# Patient Record
Sex: Female | Born: 1935 | Race: Black or African American | Hispanic: No | Smoking: Never smoker
Health system: Southern US, Community
[De-identification: ages and names within clinical notes are randomized; demographics above are authoritative.]

## PROBLEM LIST (undated history)

## (undated) DIAGNOSIS — E785 Hyperlipidemia, unspecified: Secondary | ICD-10-CM

## (undated) DIAGNOSIS — I10 Essential (primary) hypertension: Secondary | ICD-10-CM

## (undated) HISTORY — PX: WRIST FRACTURE SURGERY: SHX121

## (undated) HISTORY — DX: Hyperlipidemia, unspecified: E78.5

## (undated) HISTORY — DX: Essential (primary) hypertension: I10

---

## 2016-12-12 LAB — BASIC METABOLIC PANEL
BUN: 14 (ref 4–21)
CREATININE: 0.9 (ref 0.5–1.1)
GLUCOSE: 87
POTASSIUM: 3.6 (ref 3.4–5.3)
Sodium: 144 (ref 137–147)

## 2016-12-12 LAB — LIPID PANEL
CHOLESTEROL: 174 (ref 0–200)
HDL: 66 (ref 35–70)
LDL Cholesterol: 84
Triglycerides: 118 (ref 40–160)

## 2017-01-20 LAB — HM MAMMOGRAPHY

## 2017-04-15 ENCOUNTER — Ambulatory Visit: Payer: Medicare HMO | Admitting: Family Medicine

## 2017-04-15 ENCOUNTER — Encounter: Payer: Self-pay | Admitting: Family Medicine

## 2017-04-15 ENCOUNTER — Ambulatory Visit: Payer: Self-pay | Admitting: Family Medicine

## 2017-04-15 DIAGNOSIS — I10 Essential (primary) hypertension: Secondary | ICD-10-CM

## 2017-04-15 DIAGNOSIS — E785 Hyperlipidemia, unspecified: Secondary | ICD-10-CM

## 2017-04-15 DIAGNOSIS — M8580 Other specified disorders of bone density and structure, unspecified site: Secondary | ICD-10-CM | POA: Insufficient documentation

## 2017-04-15 DIAGNOSIS — M810 Age-related osteoporosis without current pathological fracture: Secondary | ICD-10-CM | POA: Insufficient documentation

## 2017-04-15 NOTE — Progress Notes (Signed)
Subjective:  Stephanie Hunt is a 82 y.o. female who presents today with a chief complaint of hypertension and to establish care.   HPI:  Patiently recently [place] closer to family.  She is here with her husband who I will also be sending.  Hypertension, Chronic Problem, new to this provider BP Readings from Last 3 Encounters:  04/15/17 100/80   Current Medications: Valsartan-HCTZ 160-25 daily, compliant without side effects. Interim History: Long-standing history.  Has been well controlled on the above medication.  ROS: Denies any chest pain, shortness of breath, dyspnea on exertion, leg edema.   Hyperlipidemia, Chronic, new problem to this provider Current medication(s): Lovastatin 40 mg daily Side Effects: None Additional history: Long-standing problem.  Has been on lovastatin for several years has done well with this.  Had blood work done about 2-3 months ago.  ROS: No chest pain or shortness of breath. No myalgias.  Osteopenia, chronic problem, new this provider Up-to-date on bone density scanning.  She is currently taking calcium-vitamin D supplements.  She has a remote history of wrist fracture but no recent fractures.  ROS: Per HPI, otherwise a 14 point review of systems was performed and was negative  PMH:  The following were reviewed and entered/updated in epic: Past Medical History:  Diagnosis Date  . Hyperlipidemia   . Hypertension    Patient Active Problem List   Diagnosis Date Noted  . Hypertension 04/15/2017  . Hyperlipidemia 04/15/2017  . Osteopenia 04/15/2017   Past Surgical History:  Procedure Laterality Date  . WRIST FRACTURE SURGERY Bilateral     Family History  Problem Relation Age of Onset  . Cancer Mother        Uterus  . Hypertension Father     Medications- reviewed and updated Current Outpatient Medications  Medication Sig Dispense Refill  . Calcium Carbonate-Vitamin D (CALTRATE 600+D PO) Take by  mouth.    . lovastatin (MEVACOR) 40 MG tablet Take 40 mg at bedtime by mouth.    . valsartan-hydrochlorothiazide (DIOVAN-HCT) 160-25 MG tablet Take 1 tablet daily by mouth.     No current facility-administered medications for this visit.    Allergies-reviewed and updated Allergies  Allergen Reactions  . Penicillins Rash   Social History   Socioeconomic History  . Marital status: Married    Spouse name: None  . Number of children: None  . Years of education: None  . Highest education level: None  Social Needs  . Financial resource strain: None  . Food insecurity - worry: None  . Food insecurity - inability: None  . Transportation needs - medical: None  . Transportation needs - non-medical: None  Occupational History  . Occupation: retired  Tobacco Use  . Smoking status: Never Smoker  . Smokeless tobacco: Never Used  Substance and Sexual Activity  . Alcohol use: No    Frequency: Never  . Drug use: No  . Sexual activity: No  Other Topics Concern  . None  Social History Narrative  . None   Objective:  Physical Exam: BP 100/80   Pulse 67   Ht 5\' 2"  (1.575 m)   Wt 140 lb (63.5 kg)   SpO2 97%   BMI 25.61 kg/m   Gen: NAD, resting comfortably CV: RRR with no murmurs appreciated Pulm: NWOB, CTAB with no crackles, wheezes, or rhonchi GI: Normal bowel sounds present. Soft, Nontender, Nondistended. MSK: No edema, cyanosis, or clubbing noted Skin: Warm, dry Neuro: Grossly normal, moves  all extremities Psych: Normal affect and thought content  Assessment/Plan:  Hypertension At goal.  Continue valsartan-HCTZ.  Follow-up in 6 months.  Will obtain recent blood work.  Hyperlipidemia Continue lovastatin.  Obtain prior blood work.  Follow-up in 6 months.  Osteopenia Continue calcium and vitamin D supplementation.  Obtain prior records.  Consider repeat DEXA pending results of her last one.  Preventative healthcare Patient reports that she had is up-to-date on her and  pneumonia.  We will obtain prior records.  Recommended annual wellness visit with patient-said she would look into this and schedule when she is ready.  Katina Degreealeb M. Jimmey RalphParker, MD 04/15/2017 3:43 PM

## 2017-04-15 NOTE — Assessment & Plan Note (Signed)
Continue lovastatin.  Obtain prior blood work.  Follow-up in 6 months.

## 2017-04-15 NOTE — Assessment & Plan Note (Signed)
At goal.  Continue valsartan-HCTZ.  Follow-up in 6 months.  Will obtain recent blood work.

## 2017-04-15 NOTE — Assessment & Plan Note (Signed)
Continue calcium and vitamin D supplementation.  Obtain prior records.  Consider repeat DEXA pending results of her last one.

## 2017-05-15 ENCOUNTER — Other Ambulatory Visit: Payer: Self-pay | Admitting: *Deleted

## 2017-05-15 MED ORDER — LOVASTATIN 40 MG PO TABS: 40.0000 mg | ORAL_TABLET | Freq: Every day | ORAL | 1 refills | Status: DC

## 2017-05-15 MED ORDER — VALSARTAN-HYDROCHLOROTHIAZIDE 160-25 MG PO TABS: 1.0000 | ORAL_TABLET | Freq: Every day | ORAL | 1 refills | Status: DC

## 2017-08-13 ENCOUNTER — Ambulatory Visit: Payer: Medicare HMO | Admitting: Family Medicine

## 2017-08-13 ENCOUNTER — Encounter: Payer: Self-pay | Admitting: Family Medicine

## 2017-08-13 VITALS — BP 124/78 | HR 60 | Temp 97.5°F | Ht 62.0 in | Wt 139.6 lb

## 2017-08-13 DIAGNOSIS — R079 Chest pain, unspecified: Secondary | ICD-10-CM

## 2017-08-13 DIAGNOSIS — I10 Essential (primary) hypertension: Secondary | ICD-10-CM

## 2017-08-13 DIAGNOSIS — E785 Hyperlipidemia, unspecified: Secondary | ICD-10-CM

## 2017-08-13 MED ORDER — VALSARTAN-HYDROCHLOROTHIAZIDE 160-25 MG PO TABS: 1.0000 | ORAL_TABLET | Freq: Every day | ORAL | 3 refills | Status: DC

## 2017-08-13 NOTE — Assessment & Plan Note (Signed)
At goal.  Continue valsartan-HCTZ.  Will need BMET soon.

## 2017-08-13 NOTE — Patient Instructions (Signed)
I refill your BP medication.  Your EKG today looks ok.  I would like for you to get a stress test soon. I will put in this order.  Take care, Dr Jimmey RalphParker

## 2017-08-13 NOTE — Assessment & Plan Note (Signed)
Continue lovastatin.  We have not yet obtained her most recent lipid panel.  She will follow-up with me in a few months.  If we have still not obtained records, we will check a lipid panel here.

## 2017-08-13 NOTE — Progress Notes (Signed)
    Subjective:  Stephanie Hunt is a 83 y.o. female who presents today for same-day appointment with a chief complaint of chest pain.   HPI:  Chest Pain, new problem Started a few weeks ago. Pain located in the middle of her chest and radiates into her left arm. She has been under a lot of stress recently due to her husband being hospitalized.  Pain is intermittent in nature however sometimes is worse with movement.  Pain does not improve with rest.  No shortness of breath.  No nausea or vomiting.  No fevers or chills.    HTN, established problem, stable On valsartan-HCTZ. Tolerates well without side effects.  Hyperlipidemia, established problem, stable On lovastatin 40 mg daily.  Tolerates this well without side effects.  ROS: Per HPI  PMH: She reports that  has never smoked. she has never used smokeless tobacco. She reports that she does not drink alcohol or use drugs.  Objective:  Physical Exam: BP 124/78 (BP Location: Left Arm, Patient Position: Sitting, Cuff Size: Normal)   Pulse 60   Temp (!) 97.5 F (36.4 C) (Oral)   Ht 5\' 2"  (1.575 m)   Wt 139 lb 9.6 oz (63.3 kg)   SpO2 98%   BMI 25.53 kg/m   Gen: NAD, resting comfortably CV: RRR with no murmurs appreciated Pulm: NWOB, CTAB with no crackles, wheezes, or rhonchi  EKG: Normal sinus rhythm.  2 PACs noted.  No acute ischemic changes.  Assessment/Plan:  Chest pain Her symptoms are fairly atypical in nature and her EKG is reassuring, however she does have a few risk factors including age, hypertension, and hyperlipidemia.  Advised patient that we should get a stress test to rule out cardiac etiology.  Patient voiced understanding.  Discussed strict reasons to seek emergent care including worsening symptoms, severe chest pain, shortness of breath, nausea/vomiting.  Hypertension At goal.  Continue valsartan-HCTZ.  Will need BMET soon.  Hyperlipidemia Continue lovastatin.  We have not yet obtained her most recent lipid panel.   She will follow-up with me in a few months.  If we have still not obtained records, we will check a lipid panel here.  Katina Degreealeb M. Jimmey RalphParker, MD 08/13/2017 10:25 AM

## 2017-08-15 ENCOUNTER — Other Ambulatory Visit: Payer: Self-pay

## 2017-08-15 MED ORDER — HYDROCHLOROTHIAZIDE 25 MG PO TABS: 25.0000 mg | ORAL_TABLET | Freq: Every day | ORAL | 1 refills | Status: DC

## 2017-08-15 MED ORDER — VALSARTAN 160 MG PO TABS: 160.0000 mg | ORAL_TABLET | Freq: Every day | ORAL | 1 refills | Status: DC

## 2017-08-20 NOTE — Progress Notes (Signed)
Subjective:   Stephanie Hunt is a 83 y.o. female who presents for Medicare Annual (Subsequent) preventive examination.  Reports health as  Dr. Jimmey RalphParker OV 08/14/27 for chest pain    Psychosocial Moved from [place] to [place] with her spouse Moved here 6 months ago Dr. Paul DykesMaurice [place]endergrass  [place]  [phone] She has requested medical records from Dr. Judson RochPendergrass's office   No care everywhere notes   Diet Eating well    BMI 24.6   Exercise [facility] [place]  Colonoscopy; does not need any more  All preventive health is up to date per the patient's report Awaiting medical records     Hearing Screening   125Hz  250Hz  500Hz  1000Hz  2000Hz  3000Hz  4000Hz  6000Hz  8000Hz   Right ear:       100    Left ear:       100    Comments: No hearing loss  Vision Screening Comments: Had laser  Still looking for a eye doctor Has  Vision check April or May 2018     There are no preventive care reminders to display for this patient.Cardiac Risk Factors include: advanced age (>5755men, 41>65 women);family history of premature cardiovascular disease;hypertension     Objective:     Vitals: BP 140/80   Pulse 81   Ht 5\' 3"  (1.6 m)   Wt 139 lb (63 kg)   SpO2 98%   BMI 24.62 kg/m   Body mass index is 24.62 kg/m.  Advanced Directives 08/21/2017  Does Patient Have a Medical Advance Directive? Yes    Tobacco Social History   Tobacco Use  Smoking Status Never Smoker  Smokeless Tobacco Never Used     Counseling given: Yes   Clinical Intake:     Past Medical History:  Diagnosis Date  . Hyperlipidemia   . Hypertension    Past Surgical History:  Procedure Laterality Date  . WRIST FRACTURE SURGERY Bilateral    Family History  Problem Relation Age of Onset  . Cancer Mother        Uterus  . Hypertension Father    Social History   Socioeconomic History  . Marital status: Married    Spouse name: Not on file  . Number of children: Not on file  .  Years of education: Not on file  . Highest education level: Not on file  Occupational History  . Occupation: retired  Engineer, productionocial Needs  . Financial resource strain: Not on file  . Food insecurity:    Worry: Not on file    Inability: Not on file  . Transportation needs:    Medical: Not on file    Non-medical: Not on file  Tobacco Use  . Smoking status: Never Smoker  . Smokeless tobacco: Never Used  Substance and Sexual Activity  . Alcohol use: No    Frequency: Never  . Drug use: No  . Sexual activity: Never  Lifestyle  . Physical activity:    Days per week: Not on file    Minutes per session: Not on file  . Stress: Not on file  Relationships  . Social connections:    Talks on phone: Not on file    Gets together: Not on file    Attends religious service: Not on file    Active member of club or organization: Not on file    Attends meetings of clubs or organizations: Not on file    Relationship status: Not on file  Other Topics Concern  . Not on file  Social History Narrative  . Not on file    Outpatient Encounter Medications as of 08/21/2017  Medication Sig  . Calcium Carbonate-Vitamin D (CALTRATE 600+D PO) Take by mouth.  . hydrochlorothiazide (HYDRODIURIL) 25 MG tablet Take 1 tablet (25 mg total) by mouth daily.  [name] lovastatin (MEVACOR) 40 MG tablet Take 1 tablet (40 mg total) by mouth at bedtime.  . valsartan (DIOVAN) 160 MG tablet Take 1 tablet (160 mg total) by mouth daily.  . valsartan-hydrochlorothiazide (DIOVAN-HCT) 160-25 MG tablet Take 1 tablet by mouth daily.   No facility-administered encounter medications on file as of 08/21/2017.     Activities of Daily Living In your present state of health, do you have any difficulty performing the following activities: 08/21/2017  Hearing? N  Vision? N  Difficulty concentrating or making decisions? N  Walking or climbing stairs? N  Dressing or bathing? N  Doing errands, shopping? N  Preparing Food and eating ? N  Using  the Toilet? N  In the past six months, have you accidently leaked urine? N  Do you have problems with loss of bowel control? N  Managing your Medications? N  Managing your Finances? N  Housekeeping or managing your Housekeeping? N    Patient Care Team: Ardith Dark, MD as PCP - General (Family Medicine)    Assessment:   This is a routine wellness examination for Stephanie Hunt.  Exercise Activities and Dietary recommendations Current Exercise Habits: Home exercise routine(new to the area and will explore opportunities ), Type of exercise: walking  Goals    . Patient Stated     Take Saturday to do something "normal" that you enjoy.        Fall Risk Fall Risk  08/21/2017 04/15/2017  Falls in the past year? No No     Depression Screen PHQ 2/9 Scores 08/21/2017 04/15/2017  PHQ - 2 Score 0 0     Cognitive Function MMSE - Mini Mental State Exam 08/21/2017  Not completed: (No Data)     Ad8 score reviewed for issues:  Issues making decisions:  Less interest in hobbies / activities:  Repeats questions, stories (family complaining):  Trouble using ordinary gadgets (microwave, computer, phone):  Forgets the month or year:   Mismanaging finances:   Remembering appts:  Daily problems with thinking and/or memory: Ad8 score is=0    There is no immunization history on file for this patient.    Screening Tests Health Maintenance  Topic Date Due  . DEXA SCAN  03/02/2018 (Originally 01/19/2002)  . PNA vac Low Risk Adult (1 of 2 - PCV13) 03/03/2019 (Originally 01/19/2002)  . TETANUS/TDAP  02/01/2027  . INFLUENZA VACCINE  Completed         Plan:      PCP Notes   Health Maintenance HM (mammogram; dexa, pneumonia vaccines and flu vaccine are all up to date per the patient Contact was made with her prior physician; geriatrician Dr. Shari Prows  [address] [place], [name] [address] The office does not have a release; will complete one and send to the  patient to sign and mail. Will postpone metric for 6 months pending information  She states she had zoster, but educated her regarding the shingrix as well   Abnormal Screens  BP was mod elevated. Set a goal to start taking a day off on Saturday  Also agreed to [facility] a local [facility] Will agreed to schedule time with the  family to assume some of hte care of her spouse  fup with Cardiology for stress test  Referrals  None   Patient concerns; Caring for spouse with memory loss- moderate level  Spent 30 minutes of education and resources given for assistance with daily needs as well as long term needs  Home health coming in now but spouse is rejecting them   Nurse Concerns; As noted  Given number for assistance in the future  Very youthful 83 yo.   Next PCP apt [facility]      I have personally reviewed and noted the following in the patient's chart:   . Medical and social history . Use of alcohol, tobacco or illicit drugs  . Current medications and supplements . Functional ability and status . Nutritional status . Physical activity . Advanced directives . List of other physicians . Hospitalizations, surgeries, and ER visits in previous 12 months . Vitals . Screenings to include cognitive, depression, and falls . Referrals and appointments  In addition, I have reviewed and discussed with patient certain preventive protocols, quality metrics, and best practice recommendations. A written personalized care plan for preventive services as well as general preventive health recommendations were provided to patient.     Montine Circle, RN  08/21/2017

## 2017-08-21 ENCOUNTER — Telehealth: Payer: Self-pay

## 2017-08-21 ENCOUNTER — Encounter: Payer: Self-pay | Admitting: *Deleted

## 2017-08-21 ENCOUNTER — Ambulatory Visit (INDEPENDENT_AMBULATORY_CARE_PROVIDER_SITE_OTHER): Payer: Medicare HMO | Admitting: *Deleted

## 2017-08-21 VITALS — BP 140/80 | HR 81 | Ht 63.0 in | Wt 139.0 lb

## 2017-08-21 DIAGNOSIS — Z0000 Encounter for general adult medical examination without abnormal findings: Secondary | ICD-10-CM

## 2017-08-21 NOTE — Patient Instructions (Addendum)
Stephanie Hunt , Thank you for taking time to come for your Medicare Wellness Visit. I appreciate your ongoing commitment to your health goals. Please review the following plan we discussed and let me know if I can assist you in the future.  Stephanie Hunt nu [phone]  Please review any information online from [employer] 24 hour day is a great read!  Will check your records for the date of your Bone density  Mammogram  Pap  Dates of your pneumonia vaccines  And tetanus   You had the zostervax which was a shingles vaccine. Now we have a new vaccines called the shingrix Shingrix is a vaccine for the prevention of Shingles in Adults 50 and older.  If you are on Medicare, you can request a prescription from your doctor to be filled at a pharmacy.  Please check with your benefits regarding applicable copays or out of pocket expenses.  The Shingrix is given in 2 vaccines approx 8 weeks apart. You must receive the 2nd dose prior to 6 months from receipt of the first.   May try to find a Y here near you .   These are the goals we discussed: Goals    . Patient Stated     Take Saturday to do something "normal" that you enjoy.        This is a list of the screening recommended for you and due dates:  Health Maintenance  Topic Date Due  . DEXA scan (bone density measurement)  01/19/2002  . Pneumonia vaccines (1 of 2 - PCV13) 01/19/2002  . Tetanus Vaccine  02/01/2027  . Flu Shot  Completed    Diabetes and weight loss; [facility]  Phone: [phone]   [place] [facility]; [phone] ASK FOR [facility] Ask about caregiver groups  Sr. Line; 251-705-2429 Get resource to get information on any and all community programs for Seniors  Community solutions; "Aging Gracefully In Place" program; can request or apply  [employer]: 985-258-5515 Community Health Response Program -253-664-4034 Public Health Dept; Need to be a skilled visit but can assist with  bathing as well; [phone]  Adult center for Enrichment;  Call Senior Line; [phone]  Adult day services include Adult Day Care, Adult Day Healthcare, Group Respite, Care Partners, Volunteer In [employer], Education and Support Program   Dept of Social Services; Call [phone] and ask for SW on call  Options for Medicaid include the Community Alternatives program; [place] (personal care services) or PACE program, which is a medical and social program combined   Caregiver support group and information regarding [place] is at the; [facility] Address: [address] [place], [place] [address]  Phone: [phone]     Deaf & Hard of [place] - can assist with hearing aid x 1  No reviews  [place]  [place] [address]  [phone]     Fall Prevention in the [place] can cause injuries. They can happen to people of all ages. There are many things you can do to make your home safe and to help prevent falls. What can I do on the outside of my home?  Regularly fix the edges of walkways and driveways and fix any cracks.  Remove anything that might make you trip as you walk through a door, such as a raised step or threshold.  Trim any bushes or trees on the path to your home.  Use  bright outdoor lighting.  Clear any walking paths of anything that might make someone trip, such as rocks or tools.  Regularly check to see if handrails are loose or broken. Make sure that both sides of any steps have handrails.  Any raised decks and porches should have guardrails on the edges.  Have any leaves, snow, or ice cleared regularly.  Use sand or salt on walking paths during winter.  Clean up any spills in your garage right away. This includes oil or grease spills. What can I do in the bathroom?  Use night lights.  Install grab bars by the toilet and in the tub and shower. Do not use towel bars as  grab bars.  Use non-skid mats or decals in the tub or shower.  If you need to sit down in the shower, use a plastic, non-slip stool.  Keep the floor dry. Clean up any water that spills on the floor as soon as it happens.  Remove soap buildup in the tub or shower regularly.  Attach bath mats securely with double-sided non-slip rug tape.  Do not have throw rugs and other things on the floor that can make you trip. What can I do in the bedroom?  Use night lights.  Make sure that you have a light by your bed that is easy to reach.  Do not use any sheets or blankets that are too big for your bed. They should not hang down onto the floor.  Have a firm chair that has side arms. You can use this for support while you get dressed.  Do not have throw rugs and other things on the floor that can make you trip. What can I do in the kitchen?  Clean up any spills right away.  Avoid walking on wet floors.  Keep items that you use a lot in easy-to-reach places.  If you need to reach something above you, use a strong step stool that has a grab bar.  Keep electrical cords out of the way.  Do not use floor polish or wax that makes floors slippery. If you must use wax, use non-skid floor wax.  Do not have throw rugs and other things on the floor that can make you trip. What can I do with my stairs?  Do not leave any items on the stairs.  Make sure that there are handrails on both sides of the stairs and use them. Fix handrails that are broken or loose. Make sure that handrails are as long as the stairways.  Check any carpeting to make sure that it is firmly attached to the stairs. Fix any carpet that is loose or worn.  Avoid having throw rugs at the top or bottom of the stairs. If you do have throw rugs, attach them to the floor with carpet tape.  Make sure that you have a light switch at the top of the stairs and the bottom of the stairs. If you do not have them, ask someone to add them  for you. What else can I do to help prevent falls?  Wear shoes that: ? Do not have high heels. ? Have rubber bottoms. ? Are comfortable and fit you well. ? Are closed at the toe. Do not wear sandals.  If you use a stepladder: ? Make sure that it is fully opened. Do not climb a closed stepladder. ? Make sure that both sides of the stepladder are locked into place. ? Ask someone to hold it for you,  if possible.  Clearly mark and make sure that you can see: ? Any grab bars or handrails. ? First and last steps. ? Where the edge of each step is.  Use tools that help you move around (mobility aids) if they are needed. These include: ? Canes. ? Walkers. ? Scooters. ? Crutches.  Turn on the lights when you go into a dark area. Replace any light bulbs as soon as they burn out.  Set up your furniture so you have a clear path. Avoid moving your furniture around.  If any of your floors are uneven, fix them.  If there are any pets around you, be aware of where they are.  Review your medicines with your doctor. Some medicines can make you feel dizzy. This can increase your chance of falling. Ask your doctor what other things that you can do to help prevent falls. This information is not intended to replace advice given to you by your health care provider. Make sure you discuss any questions you have with your health care provider. Document Released: 03/16/2009 Document Revised: 10/26/2015 Document Reviewed: 06/24/2014 Elsevier Interactive Patient Education  2018 [place] Maintenance, Female Adopting a healthy lifestyle and getting preventive care can go a long way to promote health and wellness. Talk with your health care provider about what schedule of regular examinations is right for you. This is a good chance for you to check in with your provider about disease prevention and staying healthy. In between checkups, there are plenty of things you can do on your own. Experts  have done a lot of research about which lifestyle changes and preventive measures are most likely to keep you healthy. Ask your health care provider for more information. Weight and diet Eat a healthy diet  Be sure to include plenty of vegetables, fruits, low-fat dairy products, and lean protein.  Do not eat a lot of foods high in solid fats, added sugars, or salt.  Get regular exercise. This is one of the most important things you can do for your health. ? Most adults should exercise for at least 150 minutes each week. The exercise should increase your heart rate and make you sweat (moderate-intensity exercise). ? Most adults should also do strengthening exercises at least twice a week. This is in addition to the moderate-intensity exercise.  Maintain a healthy weight  Body mass index (BMI) is a measurement that can be used to identify possible weight problems. It estimates body fat based on height and weight. Your health care provider can help determine your BMI and help you achieve or maintain a healthy weight.  For females 71 years of age and older: ? A BMI below 18.5 is considered underweight. ? A BMI of 18.5 to 24.9 is normal. ? A BMI of 25 to 29.9 is considered overweight. ? A BMI of 30 and above is considered obese.  Watch levels of cholesterol and blood lipids  You should start having your blood tested for lipids and cholesterol at 81 years of age, then have this test every 5 years.  You may need to have your cholesterol levels checked more often if: ? Your lipid or cholesterol levels are high. ? You are older than 81 years of age. ? You are at high risk for heart disease.  Cancer screening Lung Cancer  Lung cancer screening is recommended for adults 48-16 years old who are at high risk for lung cancer because of a history of smoking.  A yearly  low-dose CT scan of the lungs is recommended for people who: ? Currently smoke. ? Have quit within the past 15 years. ? Have  at least a 30-pack-year history of smoking. A pack year is smoking an average of one pack of cigarettes a day for 1 year.  Yearly screening should continue until it has been 15 years since you quit.  Yearly screening should stop if you develop a health problem that would prevent you from having lung cancer treatment.  Breast Cancer  Practice breast self-awareness. This means understanding how your breasts normally appear and feel.  It also means doing regular breast self-exams. Let your health care provider know about any changes, no matter how small.  If you are in your 20s or 30s, you should have a clinical breast exam (CBE) by a health care provider every 1-3 years as part of a regular health exam.  If you are 50 or older, have a CBE every year. Also consider having a breast X-ray (mammogram) every year.  If you have a family history of breast cancer, talk to your health care provider about genetic screening.  If you are at high risk for breast cancer, talk to your health care provider about having an MRI and a mammogram every year.  Breast cancer gene (BRCA) assessment is recommended for women who have family members with BRCA-related cancers. BRCA-related cancers include: ? Breast. ? Ovarian. ? Tubal. ? Peritoneal cancers.  Results of the assessment will determine the need for genetic counseling and BRCA1 and BRCA2 testing.  Cervical Cancer Your health care provider may recommend that you be screened regularly for cancer of the pelvic organs (ovaries, uterus, and vagina). This screening involves a pelvic examination, including checking for microscopic changes to the surface of your cervix (Pap test). You may be encouraged to have this screening done every 3 years, beginning at age 57.  For women ages 54-65, health care providers may recommend pelvic exams and Pap testing every 3 years, or they may recommend the Pap and pelvic exam, combined with testing for human papilloma virus  (HPV), every 5 years. Some types of HPV increase your risk of cervical cancer. Testing for HPV may also be done on women of any age with unclear Pap test results.  Other health care providers may not recommend any screening for nonpregnant women who are considered low risk for pelvic cancer and who do not have symptoms. Ask your health care provider if a screening pelvic exam is right for you.  If you have had past treatment for cervical cancer or a condition that could lead to cancer, you need Pap tests and screening for cancer for at least 20 years after your treatment. If Pap tests have been discontinued, your risk factors (such as having a new sexual partner) need to be reassessed to determine if screening should resume. Some women have medical problems that increase the chance of getting cervical cancer. In these cases, your health care provider may recommend more frequent screening and Pap tests.  Colorectal Cancer  This type of cancer can be detected and often prevented.  Routine colorectal cancer screening usually begins at 81 years of age and continues through 81 years of age.  Your health care provider may recommend screening at an earlier age if you have risk factors for colon cancer.  Your health care provider may also recommend using home test kits to check for hidden blood in the stool.  A small camera at the end of a  tube can be used to examine your colon directly (sigmoidoscopy or colonoscopy). This is done to check for the earliest forms of colorectal cancer.  Routine screening usually begins at age 69.  Direct examination of the colon should be repeated every 5-10 years through 81 years of age. However, you may need to be screened more often if early forms of precancerous polyps or small growths are found.  Skin Cancer  Check your skin from head to toe regularly.  Tell your health care provider about any new moles or changes in moles, especially if there is a change in a  mole's shape or color.  Also tell your health care provider if you have a mole that is larger than the size of a pencil eraser.  Always use sunscreen. Apply sunscreen liberally and repeatedly throughout the day.  Protect yourself by wearing long sleeves, pants, a wide-brimmed hat, and sunglasses whenever you are outside.  Heart disease, diabetes, and high blood pressure  High blood pressure causes heart disease and increases the risk of stroke. High blood pressure is more likely to develop in: ? People who have blood pressure in the high end of the normal range (130-139/85-89 mm Hg). ? People who are overweight or obese. ? People who are African American.  If you are 98-62 years of age, have your blood pressure checked every 3-5 years. If you are 65 years of age or older, have your blood pressure checked every year. You should have your blood pressure measured twice-once when you are at a hospital or clinic, and once when you are not at a hospital or clinic. Record the average of the two measurements. To check your blood pressure when you are not at a hospital or clinic, you can use: ? An automated blood pressure machine at a pharmacy. ? A home blood pressure monitor.  If you are between 16 years and 61 years old, ask your health care provider if you should take aspirin to prevent strokes.  Have regular diabetes screenings. This involves taking a blood sample to check your fasting blood sugar level. ? If you are at a normal weight and have a low risk for diabetes, have this test once every three years after 81 years of age. ? If you are overweight and have a high risk for diabetes, consider being tested at a younger age or more often. Preventing infection Hepatitis B  If you have a higher risk for hepatitis B, you should be screened for this virus. You are considered at high risk for hepatitis B if: ? You were born in a country where hepatitis B is common. Ask your health care provider  which countries are considered high risk. ? Your parents were born in a high-risk country, and you have not been immunized against hepatitis B (hepatitis B vaccine). ? You have HIV or AIDS. ? You use needles to inject street drugs. ? You live with someone who has hepatitis B. ? You have had sex with someone who has hepatitis B. ? You get hemodialysis treatment. ? You take certain medicines for conditions, including cancer, organ transplantation, and autoimmune conditions.  Hepatitis C  Blood testing is recommended for: ? Everyone born from [birth year] through 1963. ? Anyone with known risk factors for hepatitis C.  Sexually transmitted infections (STIs)  You should be screened for sexually transmitted infections (STIs) including gonorrhea and chlamydia if: ? You are sexually active and are younger than 81 years of age. ? You are older than  81 years of age and your health care provider tells you that you are at risk for this type of infection. ? Your sexual activity has changed since you were last screened and you are at an increased risk for chlamydia or gonorrhea. Ask your health care provider if you are at risk.  If you do not have HIV, but are at risk, it may be recommended that you take a prescription medicine daily to prevent HIV infection. This is called pre-exposure prophylaxis (PrEP). You are considered at risk if: ? You are sexually active and do not regularly use condoms or know the HIV status of your partner(s). ? You take drugs by injection. ? You are sexually active with a partner who has HIV.  Talk with your health care provider about whether you are at high risk of being infected with HIV. If you choose to begin PrEP, you should first be tested for HIV. You should then be tested every 3 months for as long as you are taking PrEP. Pregnancy  If you are premenopausal and you may become pregnant, ask your health care provider about preconception counseling.  If you may become  pregnant, take 400 to 800 micrograms (mcg) of folic acid every day.  If you want to prevent pregnancy, talk to your health care provider about birth control (contraception). Osteoporosis and menopause  Osteoporosis is a disease in which the bones lose minerals and strength with aging. This can result in serious bone fractures. Your risk for osteoporosis can be identified using a bone density scan.  If you are 83 years of age or older, or if you are at risk for osteoporosis and fractures, ask your health care provider if you should be screened.  Ask your health care provider whether you should take a calcium or vitamin D supplement to lower your risk for osteoporosis.  Menopause may have certain physical symptoms and risks.  Hormone replacement therapy may reduce some of these symptoms and risks. Talk to your health care provider about whether hormone replacement therapy is right for you. Follow these instructions at home:  Schedule regular health, dental, and eye exams.  Stay current with your immunizations.  Do not use any tobacco products including cigarettes, chewing tobacco, or electronic cigarettes.  If you are pregnant, do not drink alcohol.  If you are breastfeeding, limit how much and how often you drink alcohol.  Limit alcohol intake to no more than 1 drink per day for nonpregnant women. One drink equals 12 ounces of beer, 5 ounces of wine, or 1 ounces of hard liquor.  Do not use street drugs.  Do not share needles.  Ask your health care provider for help if you need support or information about quitting drugs.  Tell your health care provider if you often feel depressed.  Tell your health care provider if you have ever been abused or do not feel safe at home. This information is not intended to replace advice given to you by your health care provider. Make sure you discuss any questions you have with your health care provider. Document Released: 12/03/2010 Document  Revised: 10/26/2015 Document Reviewed: 02/21/2015 Elsevier Interactive Patient Education  [employer].

## 2017-08-21 NOTE — Progress Notes (Signed)
I have personally reviewed the Medicare Annual Wellness Visit and agree with the assessment and plan.  Katina Degreealeb M. Jimmey RalphParker, MD 08/21/2017 12:38 PM

## 2017-08-21 NOTE — Telephone Encounter (Signed)
Call to Dr[facility] Ron[facility]render[facility]ast from [facility] for medical record info regarding IMM and preventive health and they did not have a release.  Sent release to the patient to sign and bring back to the office or mail back so we can fax to Dr. Ronie SpiesPrendergast- Geriatrician.  Montine CircleSusan Tawnya Pujol RN

## 2017-09-24 ENCOUNTER — Encounter: Payer: Self-pay | Admitting: Physical Therapy

## 2018-02-24 ENCOUNTER — Encounter: Payer: Medicare HMO | Admitting: Family Medicine

## 2018-03-24 ENCOUNTER — Encounter: Payer: Self-pay | Admitting: Family Medicine

## 2018-03-24 ENCOUNTER — Ambulatory Visit: Payer: Medicare HMO | Admitting: Family Medicine

## 2018-03-24 VITALS — BP 136/82 | HR 62 | Temp 98.0°F | Ht 63.0 in | Wt 138.8 lb

## 2018-03-24 DIAGNOSIS — I10 Essential (primary) hypertension: Secondary | ICD-10-CM | POA: Diagnosis not present

## 2018-03-24 DIAGNOSIS — Z23 Encounter for immunization: Secondary | ICD-10-CM

## 2018-03-24 DIAGNOSIS — E785 Hyperlipidemia, unspecified: Secondary | ICD-10-CM | POA: Diagnosis not present

## 2018-03-24 DIAGNOSIS — M8580 Other specified disorders of bone density and structure, unspecified site: Secondary | ICD-10-CM

## 2018-03-24 LAB — BASIC METABOLIC PANEL
BUN: 16 mg/dL (ref 6–23)
CALCIUM: 10 mg/dL (ref 8.4–10.5)
CO2: 32 mEq/L (ref 19–32)
Chloride: 103 mEq/L (ref 96–112)
Creatinine, Ser: 0.89 mg/dL (ref 0.40–1.20)
GFR: 78.25 mL/min (ref >60.00)
Glucose, Bld: 88 mg/dL (ref 70–99)
Potassium: 4.3 mEq/L (ref 3.5–5.1)
SODIUM: 141 meq/L (ref 135–145)

## 2018-03-24 LAB — CBC
HCT: 42.5 % (ref 36.0–46.0)
Hemoglobin: 14.4 g/dL (ref 12.0–15.0)
MCHC: 33.8 g/dL (ref 30.0–36.0)
MCV: 88.4 fl (ref 78.0–100.0)
Platelets: 219 10*3/uL (ref 150.0–400.0)
RBC: 4.8 Mil/uL (ref 3.87–5.11)
RDW: 14.7 % (ref 11.5–15.5)
WBC: 6.9 10*3/uL (ref 4.0–10.5)

## 2018-03-24 LAB — LIPID PANEL
Cholesterol: 238 mg/dL — ABNORMAL HIGH (ref 0–200)
HDL: 64.4 mg/dL (ref >39.00)
LDL Cholesterol: 150 mg/dL — ABNORMAL HIGH (ref 0–99)
NonHDL: 173.49
TRIGLYCERIDES: 118 mg/dL (ref 0.0–149.0)
Total CHOL/HDL Ratio: 4
VLDL: 23.6 mg/dL (ref 0.0–40.0)

## 2018-03-24 MED ORDER — VALSARTAN 160 MG PO TABS: 160.0000 mg | ORAL_TABLET | Freq: Every day | ORAL | 3 refills | Status: DC

## 2018-03-24 MED ORDER — HYDROCHLOROTHIAZIDE 25 MG PO TABS: 25.0000 mg | ORAL_TABLET | Freq: Every day | ORAL | 3 refills | Status: DC

## 2018-03-24 NOTE — Patient Instructions (Signed)
It was very nice to see you today!  I am glad that you are doing well. Please continue staying active.   No medication changes today.  We will check blood work today.  Please schedule your bone density scan.  We will give you your flu shot today.  Come back to see me in 1 year, or sooner as needed.   Take care, Dr Jimmey Ralph

## 2018-03-24 NOTE — Assessment & Plan Note (Signed)
At goal.  Continue valsartan 60 mg daily.  Check CBC and BMET today.

## 2018-03-24 NOTE — Assessment & Plan Note (Signed)
Check lipid panel.  Given age and lack of comorbidities, will likely not restart statin depending on results of lipid panel.

## 2018-03-24 NOTE — Progress Notes (Signed)
   Subjective:  Stephanie Hunt is a 83 y.o. female who presents today with a chief complaint of HTN follow up.   HPI:  HTN Currently on HCTZ 25 mg daily and valsartan 160 mg daily.  Tolerating both well.  No reported chest pain or shortness of breath.  HLD Patient was previously prescribed lovastatin 40 mg daily.  This was causing her to have headaches and she has stopped taking it for the past several weeks to months.  Osteopenia Taking calcium and vitamin D supplements. Does not remember her last bone density scan.   ROS: Per HPI  PMH: She reports that she has never smoked. She has never used smokeless tobacco. She reports that she does not drink alcohol or use drugs.  Objective:  Physical Exam: BP 136/82 (BP Location: Left Arm, Patient Position: Sitting, Cuff Size: Normal)   Pulse 62   Temp 98 F (36.7 C) (Oral)   Ht 5\' 3"  (1.6 m)   Wt 138 lb 12.8 oz (63 kg)   SpO2 97%   BMI 24.59 kg/m   Gen: NAD, resting comfortably CV: RRR with no murmurs appreciated Pulm: NWOB, CTAB with no crackles, wheezes, or rhonchi  Assessment/Plan:  Osteopenia Continue calcium and vitamin D supplementation.  Order placed for repeat DEXA scan.  Hypertension At goal.  Continue valsartan 60 mg daily.  Check CBC and BMET today.  Hyperlipidemia Check lipid panel.  Given age and lack of comorbidities, will likely not restart statin depending on results of lipid panel.  Preventive health care Flu shot given today.  [name]. Jimmey Ralph, MD 03/24/2018 11:58 AM

## 2018-03-24 NOTE — Assessment & Plan Note (Signed)
Continue calcium and vitamin D supplementation.  Order placed for repeat DEXA scan.

## 2018-03-26 NOTE — Progress Notes (Signed)
Please inform patient of the following:  Blood counts are normal. Electrolytes, kidney function, and blood sugar are all normal. Her cholesterol is elevated. Would recommend restarting lovastatin help lower her risk of heart attack stroke. If she is worried about side effects, we can do half a dose of 20mg  daily.  [name]. Jimmey Ralph, MD 03/26/2018 2:13 PM

## 2018-04-01 ENCOUNTER — Ambulatory Visit (INDEPENDENT_AMBULATORY_CARE_PROVIDER_SITE_OTHER)
Admission: RE | Admit: 2018-04-01 | Discharge: 2018-04-01 | Disposition: A | Discharge status: Home / Self Care | Payer: Medicare HMO | Source: Ambulatory Visit | Attending: Family Medicine | Admitting: Family Medicine

## 2018-04-01 ENCOUNTER — Other Ambulatory Visit: Payer: Self-pay

## 2018-04-01 DIAGNOSIS — M8580 Other specified disorders of bone density and structure, unspecified site: Secondary | ICD-10-CM

## 2018-04-01 DIAGNOSIS — M85841 Other specified disorders of bone density and structure, right hand: Secondary | ICD-10-CM

## 2018-04-01 MED ORDER — LOVASTATIN 20 MG PO TABS: 20.0000 mg | ORAL_TABLET | Freq: Every day | ORAL | 3 refills | Status: DC

## 2018-04-06 NOTE — Progress Notes (Signed)
Please inform patient of the following:  Her dexa scan shows mild thinning of the bones consistent with osteopenia. She does not have osteoporosis. I would like for her to continue taking her calcium and vitamin D supplements. We can recheck in 2 years.  [name]. Jimmey Ralph, MD 04/06/2018 2:31 PM

## 2018-04-10 DIAGNOSIS — H524 Presbyopia: Secondary | ICD-10-CM | POA: Diagnosis not present

## 2018-04-20 DIAGNOSIS — H40013 Open angle with borderline findings, low risk, bilateral: Secondary | ICD-10-CM | POA: Diagnosis not present

## 2018-08-18 ENCOUNTER — Encounter: Payer: Self-pay | Admitting: Family Medicine

## 2018-08-18 ENCOUNTER — Other Ambulatory Visit: Payer: Self-pay

## 2018-08-18 ENCOUNTER — Ambulatory Visit (INDEPENDENT_AMBULATORY_CARE_PROVIDER_SITE_OTHER): Payer: Medicare HMO | Admitting: Family Medicine

## 2018-08-18 VITALS — BP 132/78 | HR 74 | Temp 98.7°F | Ht 63.0 in | Wt 138.0 lb

## 2018-08-18 DIAGNOSIS — J329 Chronic sinusitis, unspecified: Secondary | ICD-10-CM | POA: Diagnosis not present

## 2018-08-18 DIAGNOSIS — R05 Cough: Secondary | ICD-10-CM

## 2018-08-18 DIAGNOSIS — R059 Cough, unspecified: Secondary | ICD-10-CM

## 2018-08-18 MED ORDER — BENZONATATE 200 MG PO CAPS: 200.0000 mg | ORAL_CAPSULE | Freq: Two times a day (BID) | ORAL | 0 refills | Status: DC | PRN

## 2018-08-18 MED ORDER — AZITHROMYCIN 250 MG PO TABS: ORAL_TABLET | ORAL | 0 refills | Status: DC

## 2018-08-18 MED ORDER — IPRATROPIUM BROMIDE 0.06 % NA SOLN: 2.0000 | Freq: Four times a day (QID) | NASAL | 0 refills | Status: DC

## 2018-08-18 NOTE — Patient Instructions (Signed)
Start the atrovent and zpack.  Start tessalon for your cough.  Please stay well hydrated.  You can take tylenol and/or motrin as needed for low grade fever and pain.  Please let me know if your symptoms worsen or fail to improve.  Take care, Dr Lord Lancour  

## 2018-08-18 NOTE — Progress Notes (Signed)
   Chief Complaint:  Stephanie Hunt is a 84 y.o. female who presents for same day appointment with a chief complaint of cough.   Assessment/Plan:  Cough No red flags.  Likely secondary to viral URI.  Patient does not meet screening criteria for COVID19 given her lack of fever and lack of known or suspected exposure.  Will treat with Atrovent nasal spray, Z-Pak, and Tessalon.  Encouraged good oral hydration.  Recommended rest.  Discussed reasons return to care.  Follow-up as needed.     Subjective:  HPI:  Cough, acute problem Started 5 days ago.  Improving over time.  Associated with rhinorrhea and sore throat.  Was recently on a field trip with several school-aged children in [facility].  Symptoms started shortly after that.  Initially had some body aches that have since resolved.  No fevers.  No sputum production.  No treatments tried.  No other obvious alleviating or aggravating factors.    ROS: Per HPI  PMH: She reports that she has never smoked. She has never used smokeless tobacco. She reports that she does not drink alcohol or use drugs.      Objective:  Physical Exam: BP 132/78 (BP Location: Left Arm, Patient Position: Sitting, Cuff Size: Normal)   Pulse 74   Temp 98.7 F (37.1 C) (Oral)   Ht 5\' 3"  (1.6 m)   Wt 138 lb (62.6 kg)   SpO2 97%   BMI 24.45 kg/m   Gen: NAD, resting comfortably HEENT: TMs clear.  OP erythematous.  Nasal mucosa erythematous and boggy bilaterally. CV: Regular rate and rhythm with no murmurs appreciated Pulm: Normal work of breathing, clear to auscultation bilaterally with no crackles, wheezes, or rhonchi      Journei Thomassen M. Jimmey Ralph, MD 08/18/2018 12:13 PM

## 2018-10-16 ENCOUNTER — Other Ambulatory Visit: Payer: Self-pay | Admitting: Family Medicine

## 2019-01-06 ENCOUNTER — Telehealth: Payer: Self-pay | Admitting: Family Medicine

## 2019-01-06 NOTE — Telephone Encounter (Signed)
See note

## 2019-01-06 NOTE — Telephone Encounter (Signed)
Medication Refill - Medication: valsartan-hydrochlorothiazide (DIOVAN-HCT) 160-25 MG tablet . Please contact pt if there is an issue regarding refill.  Has the patient contacted their pharmacy? Yes.   (Agent: If no, request that the patient contact the pharmacy for the refill.) (Agent: If yes, when and what did the pharmacy advise?)  Preferred Pharmacy (with phone number or street name):  [place], [place] - [address] [phone] (Phone) [phone] (Fax)     Agent: Please be advised that RX refills may take up to 3 business days. We ask that you follow-up with your pharmacy.

## 2019-01-07 ENCOUNTER — Other Ambulatory Visit: Payer: Self-pay

## 2019-01-07 MED ORDER — VALSARTAN 160 MG PO TABS: 160.0000 mg | ORAL_TABLET | Freq: Every day | ORAL | 0 refills | Status: DC

## 2019-01-08 ENCOUNTER — Other Ambulatory Visit: Payer: Self-pay | Admitting: Family Medicine

## 2019-01-08 ENCOUNTER — Other Ambulatory Visit: Payer: Self-pay

## 2019-01-08 MED ORDER — VALSARTAN-HYDROCHLOROTHIAZIDE 160-25 MG PO TABS: 1.0000 | ORAL_TABLET | Freq: Every day | ORAL | 0 refills | Status: DC

## 2019-01-08 NOTE — Telephone Encounter (Signed)
Patient need to schedule an ov for more refills. 

## 2019-01-08 NOTE — Telephone Encounter (Signed)
Rx sent 

## 2019-03-17 ENCOUNTER — Encounter: Payer: Self-pay | Admitting: Family Medicine

## 2019-03-17 ENCOUNTER — Other Ambulatory Visit: Payer: Self-pay

## 2019-03-17 ENCOUNTER — Ambulatory Visit (INDEPENDENT_AMBULATORY_CARE_PROVIDER_SITE_OTHER): Payer: Medicare HMO | Admitting: Family Medicine

## 2019-03-17 ENCOUNTER — Ambulatory Visit (INDEPENDENT_AMBULATORY_CARE_PROVIDER_SITE_OTHER): Payer: Medicare HMO

## 2019-03-17 VITALS — BP 144/82 | HR 52 | Temp 98.0°F | Ht 63.0 in | Wt 137.4 lb

## 2019-03-17 VITALS — BP 132/76 | Temp 98.0°F | Ht 63.0 in | Wt 137.3 lb

## 2019-03-17 DIAGNOSIS — I10 Essential (primary) hypertension: Secondary | ICD-10-CM

## 2019-03-17 DIAGNOSIS — Z23 Encounter for immunization: Secondary | ICD-10-CM

## 2019-03-17 DIAGNOSIS — Z0000 Encounter for general adult medical examination without abnormal findings: Secondary | ICD-10-CM | POA: Diagnosis not present

## 2019-03-17 DIAGNOSIS — M79641 Pain in right hand: Secondary | ICD-10-CM

## 2019-03-17 DIAGNOSIS — M79642 Pain in left hand: Secondary | ICD-10-CM | POA: Diagnosis not present

## 2019-03-17 DIAGNOSIS — E785 Hyperlipidemia, unspecified: Secondary | ICD-10-CM

## 2019-03-17 DIAGNOSIS — Z0001 Encounter for general adult medical examination with abnormal findings: Secondary | ICD-10-CM

## 2019-03-17 DIAGNOSIS — M8580 Other specified disorders of bone density and structure, unspecified site: Secondary | ICD-10-CM

## 2019-03-17 LAB — COMPREHENSIVE METABOLIC PANEL
ALT: 12 U/L (ref 0–35)
AST: 18 U/L (ref 0–37)
Albumin: 4.2 g/dL (ref 3.5–5.2)
Alkaline Phosphatase: 87 U/L (ref 39–117)
BUN: 14 mg/dL (ref 6–23)
CO2: 31 mEq/L (ref 19–32)
Calcium: 9.8 mg/dL (ref 8.4–10.5)
Chloride: 100 mEq/L (ref 96–112)
Creatinine, Ser: 0.95 mg/dL (ref 0.40–1.20)
GFR: 68.12 mL/min (ref >60.00)
Glucose, Bld: 83 mg/dL (ref 70–99)
Potassium: 3.7 mEq/L (ref 3.5–5.1)
Sodium: 142 mEq/L (ref 135–145)
Total Bilirubin: 0.6 mg/dL (ref 0.2–1.2)
Total Protein: 6.8 g/dL (ref 6.0–8.3)

## 2019-03-17 LAB — CBC
HCT: 41.8 % (ref 36.0–46.0)
Hemoglobin: 13.8 g/dL (ref 12.0–15.0)
MCHC: 33 g/dL (ref 30.0–36.0)
MCV: 89.8 fl (ref 78.0–100.0)
Platelets: 248 10*3/uL (ref 150.0–400.0)
RBC: 4.65 Mil/uL (ref 3.87–5.11)
RDW: 14.7 % (ref 11.5–15.5)
WBC: 5.9 10*3/uL (ref 4.0–10.5)

## 2019-03-17 LAB — VITAMIN D 25 HYDROXY (VIT D DEFICIENCY, FRACTURES): VITD: 47.49 ng/mL (ref 30.00–100.00)

## 2019-03-17 LAB — LIPID PANEL
Cholesterol: 244 mg/dL — ABNORMAL HIGH (ref 0–200)
HDL: 77.2 mg/dL (ref >39.00)
LDL Cholesterol: 149 mg/dL — ABNORMAL HIGH (ref 0–99)
NonHDL: 166.66
Total CHOL/HDL Ratio: 3
Triglycerides: 89 mg/dL (ref 0.0–149.0)
VLDL: 17.8 mg/dL (ref 0.0–40.0)

## 2019-03-17 LAB — TSH: TSH: 1.65 u[IU]/mL (ref 0.35–4.50)

## 2019-03-17 MED ORDER — VALSARTAN-HYDROCHLOROTHIAZIDE 160-25 MG PO TABS: 1.0000 | ORAL_TABLET | Freq: Every day | ORAL | 3 refills | Status: DC

## 2019-03-17 NOTE — Assessment & Plan Note (Signed)
Off statin due to muscle aches.  Will check lipid panel today.  Given her age and lack of other cardiovascular disease, would likely not restart statin unless lipids are significantly elevated.  Would consider trial of every other day dosing versus Vascepa depending on lipid panel.

## 2019-03-17 NOTE — Progress Notes (Signed)
Chief Complaint:  Stephanie Hunt is a 84 y.o. female who presents today for her annual comprehensive physical exam.    Assessment/Plan:  Osteopenia Check vitamin D level.  Continue calcium and vitamin D supplementation.  Will need repeat DEXA scan next year.  Hyperlipidemia Off statin due to muscle aches.  Will check lipid panel today.  Given her age and lack of other cardiovascular disease, would likely not restart statin unless lipids are significantly elevated.  Would consider trial of every other day dosing versus Vascepa depending on lipid panel.  Hypertension At goal per JNC 8.  Continue valsartan-HCTZ 1 60-25 once daily.  Check CBC CMET and TSH.  Preventative Healthcare: Flu vaccine given today.  Due for bone density scan next year.  Will obtain records regarding prior pneumonia vaccine.  Patient Counseling(The following topics were reviewed and/or handout was given):  -Nutrition: Stressed importance of moderation in sodium/caffeine intake, saturated fat and cholesterol, caloric balance, sufficient intake of fresh fruits, vegetables, and fiber.  -Stressed the importance of regular exercise.   -Substance Abuse: Discussed cessation/primary prevention of tobacco, alcohol, or other drug use; driving or other dangerous activities under the influence; availability of treatment for abuse.   -Injury prevention: Discussed safety belts, safety helmets, smoke detector, smoking near bedding or upholstery.   -Sexuality: Discussed sexually transmitted diseases, partner selection, use of condoms, avoidance of unintended pregnancy and contraceptive alternatives.   -Dental health: Discussed importance of regular tooth brushing, flossing, and dental visits.  -Health maintenance and immunizations reviewed. Please refer to Health maintenance section.  Return to care in 1 year for next preventative visit.     Subjective:  HPI:  She has no acute complaints today.   She has been having some  bilateral hand pain.  She also has difficulty walking long distances.  This is due to underlying osteoarthritis.  Occasionally takes Aleve which helps with her symptoms.  Her stable, chronic medical conditions are outlined below:   # Essential Hypertension - On valsartan-HCTZ 160-25 once daily.  Tolerating well. ROS: No reported chest pain or shortness of breath.  # Dyslipidemia - Not taking statin due to muscle aches  Lifestyle Diet: No specific diets or eating plans.  Exercise: Busy with grandchildren. Likes to walk.   Depression screen PHQ 2/9 03/17/2019  Decreased Interest 0  Down, Depressed, Hopeless 0  PHQ - 2 Score 0    Health Maintenance Due  Topic Date Due  . PNA vac Low Risk Adult (1 of 2 - PCV13) 01/19/2002  . INFLUENZA VACCINE  01/02/2019     ROS: Per HPI, otherwise a complete review of systems was negative.   PMH:  The following were reviewed and entered/updated in epic: Past Medical History:  Diagnosis Date  . Hyperlipidemia   . Hypertension    Patient Active Problem List   Diagnosis Date Noted  . Hypertension 04/15/2017  . Hyperlipidemia 04/15/2017  . Osteopenia 04/15/2017   Past Surgical History:  Procedure Laterality Date  . WRIST FRACTURE SURGERY Bilateral     Family History  Problem Relation Age of Onset  . Cancer Mother        Uterus  . Hypertension Father     Medications- reviewed and updated Current Outpatient Medications  Medication Sig Dispense Refill  . Calcium Carbonate-Vitamin D (CALTRATE 600+D PO) Take by mouth.    . valsartan-hydrochlorothiazide (DIOVAN-HCT) 160-25 MG tablet Take 1 tablet by mouth daily. 90 tablet 3   No current facility-administered medications for this visit.  Allergies-reviewed and updated Allergies  Allergen Reactions  . Penicillins Rash    Social History   Socioeconomic History  . Marital status: Married    Spouse name: Not on file  . Number of children: Not on file  . Years of education:  Not on file  . Highest education level: Not on file  Occupational History  . Occupation: retired  Scientific laboratory technician  . Financial resource strain: Not on file  . Food insecurity    Worry: Not on file    Inability: Not on file  . Transportation needs    Medical: Not on file    Non-medical: Not on file  Tobacco Use  . Smoking status: Never Smoker  . Smokeless tobacco: Never Used  Substance and Sexual Activity  . Alcohol use: No    Frequency: Never  . Drug use: No  . Sexual activity: Never  Lifestyle  . Physical activity    Days per week: Not on file    Minutes per session: Not on file  . Stress: Not on file  Relationships  . Social Herbalist on phone: Not on file    Gets together: Not on file    Attends religious service: Not on file    Active member of club or organization: Not on file    Attends meetings of clubs or organizations: Not on file    Relationship status: Not on file  Other Topics Concern  . Not on file  Social History Narrative  . Not on file        Objective:  Physical Exam: BP (!) 144/82   Pulse (!) 52   Temp 98 F (36.7 C)   Ht 5\' 3"  (1.6 m)   Wt 137 lb 6.1 oz (62.3 kg)   SpO2 99%   BMI 24.34 kg/m   Body mass index is 24.34 kg/m. Wt Readings from Last 3 Encounters:  03/17/19 137 lb 6.1 oz (62.3 kg)  08/18/18 138 lb (62.6 kg)  03/24/18 138 lb 12.8 oz (63 kg)   Gen: NAD, resting comfortably HEENT: TMs normal bilaterally. OP clear. No thyromegaly noted.  CV: RRR with no murmurs appreciated Pulm: NWOB, CTAB with no crackles, wheezes, or rhonchi GI: Normal bowel sounds present. Soft, Nontender, Nondistended. MSK: no edema, cyanosis, or clubbing noted Skin: warm, dry Neuro: CN2-12 grossly intact. Strength 5/5 in upper and lower extremities. Reflexes symmetric and intact bilaterally.  Psych: Normal affect and thought content     Caleb M. Jerline Pain, MD 03/17/2019 10:02 AM

## 2019-03-17 NOTE — Progress Notes (Signed)
Subjective:   Stephanie Hunt is a 84 y.o. female who presents for Medicare Annual (Subsequent) preventive examination.  Review of Systems:   Cardiac Risk Factors include: hypertension;advanced age (>8755men, 53>65 women)     Objective:     Vitals: BP 132/76 (BP Location: Left Arm, Patient Position: Sitting, Cuff Size: Normal)   Temp 98 F (36.7 C) (Temporal)   Ht 5\' 3"  (1.6 m)   Wt 137 lb 5.6 oz (62.3 kg)   BMI 24.33 kg/m   Body mass index is 24.33 kg/m.  Advanced Directives 03/17/2019 08/21/2017  Does Patient Have a Medical Advance Directive? Yes Yes  Type of Estate agentAdvance Directive Healthcare Power of [place]orney;Living will -  Does patient want to make changes to medical advance directive? No - Patient declined -  Copy of Healthcare Power of Attorney in Chart? No - copy requested -    Tobacco Social History   Tobacco Use  Smoking Status Never Smoker  Smokeless Tobacco Never Used     Counseling given: Not Answered   Clinical Intake:  Pre-visit preparation completed: Yes  Pain : No/denies pain  Diabetes: No  How often do you need to have someone help you when you read instructions, pamphlets, or other written materials from your doctor or pharmacy?: 1 - Never  Interpreter Needed?: No  Information entered by :: Kandis Fantasiaourtney Aubrianne Molyneux LPN  Past Medical History:  Diagnosis Date  . Hyperlipidemia   . Hypertension    Past Surgical History:  Procedure Laterality Date  . WRIST FRACTURE SURGERY Bilateral    Family History  Problem Relation Age of Onset  . Cancer Mother        Uterus  . Hypertension Father    Social History   Socioeconomic History  . Marital status: Married    Spouse name: Not on file  . Number of children: Not on file  . Years of education: Not on file  . Highest education level: Not on file  Occupational History  . Occupation: retired  Engineer, productionocial Needs  . Financial resource strain: Not on file  . Food insecurity    Worry: Not on file    Inability:  Not on file  . Transportation needs    Medical: Not on file    Non-medical: Not on file  Tobacco Use  . Smoking status: Never Smoker  . Smokeless tobacco: Never Used  Substance and Sexual Activity  . Alcohol use: No    Frequency: Never  . Drug use: No  . Sexual activity: Never  Lifestyle  . Physical activity    Days per week: Not on file    Minutes per session: Not on file  . Stress: Not on file  Relationships  . Social Musicianconnections    Talks on phone: Not on file    Gets together: Not on file    Attends religious service: Not on file    Active member of club or organization: Not on file    Attends meetings of clubs or organizations: Not on file    Relationship status: Not on file  Other Topics Concern  . Not on file  Social History Narrative   Moved to area from [place] in 2018    Outpatient Encounter Medications as of 03/17/2019  Medication Sig  . Calcium Carbonate-Vitamin D (CALTRATE 600+D PO) Take by mouth.  . valsartan-hydrochlorothiazide (DIOVAN-HCT) 160-25 MG tablet Take 1 tablet by mouth daily.   No facility-administered encounter medications on file as of 03/17/2019.  Activities of Daily Living In your present state of health, do you have any difficulty performing the following activities: 03/17/2019  Hearing? N  Vision? N  Difficulty concentrating or making decisions? N  Walking or climbing stairs? N  Dressing or bathing? N  Doing errands, shopping? N  Preparing Food and eating ? N  Using the Toilet? N  In the past six months, have you accidently leaked urine? N  Do you have problems with loss of bowel control? N  Managing your Medications? N  Managing your Finances? N  Housekeeping or managing your Housekeeping? N  Some recent data might be hidden    Patient Care Team: Ardith Dark, MD as PCP - General (Family Medicine)    Assessment:   This is a routine wellness examination for Stephanie Hunt.  Exercise Activities and Dietary recommendations Current  Exercise Habits: Home exercise routine, Type of exercise: walking, Time (Minutes): 30, Frequency (Times/Week): 3, Weekly Exercise (Minutes/Week): 90, Intensity: Mild  Goals    . Patient Stated     Take Saturday to do something "normal" that you enjoy.        Fall Risk Fall Risk  03/17/2019 03/17/2019 08/21/2017 04/15/2017  Falls in the past year? 0 0 No No  Injury with Fall? 0 - - -  Follow up Education provided;Falls prevention discussed;Falls evaluation completed - - -   Is the patient's home free of loose throw rugs in walkways, pet beds, electrical cords, etc?   yes      Grab bars in the bathroom? yes      Handrails on the stairs?   yes      Adequate lighting?   yes  Timed Get Up and Go performed: completed and within normal timeframe; no gait abnormalities noted   Depression Screen PHQ 2/9 Scores 03/17/2019 03/17/2019 08/21/2017 04/15/2017  PHQ - 2 Score 0 0 0 0     Cognitive Function-no cognitive concerns at this time  MMSE - Mini Mental State Exam 08/21/2017  Not completed: (No Data)     6CIT Screen 03/17/2019  What Year? 0 points  What month? 0 points  What time? 0 points  Count back from 20 0 points  Months in reverse 0 points  Repeat phrase 0 points  Total Score 0    Immunization History  Administered Date(s) Administered  . Fluad Quad(high Dose 65+) 03/17/2019  . Influenza, High Dose Seasonal PF 03/24/2018    Qualifies for Shingles Vaccine?Discussed and patient will check with pharmacy for coverage.  Patient education handout provided   Screening Tests Health Maintenance  Topic Date Due  . PNA vac Low Risk Adult (1 of 2 - PCV13) 01/19/2002  . INFLUENZA VACCINE  01/02/2019  . DEXA SCAN  04/01/2020  . TETANUS/TDAP  02/01/2027    Cancer Screenings: Lung: Low Dose CT Chest recommended if Age 96-80 years, 30 pack-year currently smoking OR have quit w/in 15years. Patient does not qualify. Breast:  Up to date on Mammogram? Yes   Up to date of Bone  Density/Dexa? Yes; last 04/01/18 Colorectal: Not indicated     Plan:  I have personally reviewed and addressed the Medicare Annual Wellness questionnaire and have noted the following in the patient's chart:  A. Medical and social history B. Use of alcohol, tobacco or illicit drugs  C. Current medications and supplements D. Functional ability and status E.  Nutritional status F.  Physical activity G. Advance directives H. List of other physicians I.  Hospitalizations, surgeries, and ER  visits in previous 12 months J.  [place] such as hearing and vision if needed, cognitive and depression L. Referrals, records requested, and appointments- records requested from previous pcp   In addition, I have reviewed and discussed with patient certain preventive protocols, quality metrics, and best practice recommendations. A written personalized care plan for preventive services as well as general preventive health recommendations were provided to patient.   Signed,  Denman George, LPN  Nurse Health Advisor   Nurse Notes: no additional

## 2019-03-17 NOTE — Assessment & Plan Note (Signed)
Check vitamin D level.  Continue calcium and vitamin D supplementation.  Will need repeat DEXA scan next year.

## 2019-03-17 NOTE — Patient Instructions (Addendum)
Stephanie Hunt , Thank you for taking time to come for your Medicare Wellness Visit. I appreciate your ongoing commitment to your health goals. Please review the following plan we discussed and let me know if I can assist you in the future.   Screening recommendations/referrals: Colorectal Screening: Not indicated  Mammogram: up to date; last 01/20/17  Bone Density: up to date; last 04/01/18  Vision and Dental Exams: Recommended annual ophthalmology exams for early detection of glaucoma and other disorders of the eye Recommended annual dental exams for proper oral hygiene  Vaccinations: Influenza vaccine: today Pneumococcal vaccine: recommended  Tdap vaccine: recommended;  Please call your insurance company to determine your out of pocket expense. You may receive this vaccine at your local pharmacy or Health Dept. Shingles vaccine: Please call your insurance company to determine your out of pocket expense for the Shingrix vaccine. You may receive this vaccine at your local pharmacy.  Advanced directives: Please bring a copy of your POA (Power of Attorney) and/or Living Will to your next appointment.  Goals: Recommend to drink at least 6-8 8oz glasses of water per day and recommend to remove any items from the home that may cause slips or trips.  Next appointment: Please schedule your Annual Wellness Visit with your Nurse Health Advisor in one year.  Preventive Care 46 Years and Older, Female Preventive care refers to lifestyle choices and visits with your health care provider that can promote health and wellness. What does preventive care include?  A yearly physical exam. This is also called an annual well check.  Dental exams once or twice a year.  Routine eye exams. Ask your health care provider how often you should have your eyes checked.  Personal lifestyle choices, including:  Daily care of your teeth and gums.  Regular physical activity.  Eating a healthy diet.  Avoiding  tobacco and drug use.  Limiting alcohol use.  Practicing safe sex.  Taking low-dose aspirin every day if recommended by your health care provider.  Taking vitamin and mineral supplements as recommended by your health care provider. What happens during an annual well check? The services and screenings done by your health care provider during your annual well check will depend on your age, overall health, lifestyle risk factors, and family history of disease. Counseling  Your health care provider may ask you questions about your:  Alcohol use.  Tobacco use.  Drug use.  Emotional well-being.  Home and relationship well-being.  Sexual activity.  Eating habits.  History of falls.  Memory and ability to understand (cognition).  Work and work Statistician.  Reproductive health. Screening  You may have the following tests or measurements:  Height, weight, and BMI.  Blood pressure.  Lipid and cholesterol levels. These may be checked every 5 years, or more frequently if you are over 60 years old.  Skin check.  Lung cancer screening. You may have this screening every year starting at age 45 if you have a 30-pack-year history of smoking and currently smoke or have quit within the past 15 years.  Fecal occult blood test (FOBT) of the stool. You may have this test every year starting at age 2.  Flexible sigmoidoscopy or colonoscopy. You may have a sigmoidoscopy every 5 years or a colonoscopy every 10 years starting at age 74.  Hepatitis C blood test.  Hepatitis B blood test.  Sexually transmitted disease (STD) testing.  Diabetes screening. This is done by checking your blood sugar (glucose) after you have not  eaten for a while (fasting). You may have this done every 1-3 years.  Bone density scan. This is done to screen for osteoporosis. You may have this done starting at age 35.  Mammogram. This may be done every 1-2 years. Talk to your health care provider about how  often you should have regular mammograms. Talk with your health care provider about your test results, treatment options, and if necessary, the need for more tests. Vaccines  Your health care provider may recommend certain vaccines, such as:  Influenza vaccine. This is recommended every year.  Tetanus, diphtheria, and acellular pertussis (Tdap, Td) vaccine. You may need a Td booster every 10 years.  Zoster vaccine. You may need this after age 72.  Pneumococcal 13-valent conjugate (PCV13) vaccine. One dose is recommended after age 22.  Pneumococcal polysaccharide (PPSV23) vaccine. One dose is recommended after age 38. Talk to your health care provider about which screenings and vaccines you need and how often you need them. This information is not intended to replace advice given to you by your health care provider. Make sure you discuss any questions you have with your health care provider. Document Released: 06/16/2015 Document Revised: 02/07/2016 Document Reviewed: 03/21/2015 Elsevier Interactive Patient Education  2017 [employer].  Fall Prevention in the Home Falls can cause injuries. They can happen to people of all ages. There are many things you can do to make your home safe and to help prevent falls. What can I do on the outside of my home?  Regularly fix the edges of walkways and driveways and fix any cracks.  Remove anything that might make you trip as you walk through a door, such as a raised step or threshold.  Trim any bushes or trees on the path to your home.  Use bright outdoor lighting.  Clear any walking paths of anything that might make someone trip, such as rocks or tools.  Regularly check to see if handrails are loose or broken. Make sure that both sides of any steps have handrails.  Any raised decks and porches should have guardrails on the edges.  Have any leaves, snow, or ice cleared regularly.  Use sand or salt on walking paths during winter.  Clean  up any spills in your garage right away. This includes oil or grease spills. What can I do in the bathroom?  Use night lights.  Install grab bars by the toilet and in the tub and shower. Do not use towel bars as grab bars.  Use non-skid mats or decals in the tub or shower.  If you need to sit down in the shower, use a plastic, non-slip stool.  Keep the floor dry. Clean up any water that spills on the floor as soon as it happens.  Remove soap buildup in the tub or shower regularly.  Attach bath mats securely with double-sided non-slip rug tape.  Do not have throw rugs and other things on the floor that can make you trip. What can I do in the bedroom?  Use night lights.  Make sure that you have a light by your bed that is easy to reach.  Do not use any sheets or blankets that are too big for your bed. They should not hang down onto the floor.  Have a firm chair that has side arms. You can use this for support while you get dressed.  Do not have throw rugs and other things on the floor that can make you trip. What can I  do in the kitchen?  Clean up any spills right away.  Avoid walking on wet floors.  Keep items that you use a lot in easy-to-reach places.  If you need to reach something above you, use a strong step stool that has a grab bar.  Keep electrical cords out of the way.  Do not use floor polish or wax that makes floors slippery. If you must use wax, use non-skid floor wax.  Do not have throw rugs and other things on the floor that can make you trip. What can I do with my stairs?  Do not leave any items on the stairs.  Make sure that there are handrails on both sides of the stairs and use them. Fix handrails that are broken or loose. Make sure that handrails are as long as the stairways.  Check any carpeting to make sure that it is firmly attached to the stairs. Fix any carpet that is loose or worn.  Avoid having throw rugs at the top or bottom of the stairs.  If you do have throw rugs, attach them to the floor with carpet tape.  Make sure that you have a light switch at the top of the stairs and the bottom of the stairs. If you do not have them, ask someone to add them for you. What else can I do to help prevent falls?  Wear shoes that:  Do not have high heels.  Have rubber bottoms.  Are comfortable and fit you well.  Are closed at the toe. Do not wear sandals.  If you use a stepladder:  Make sure that it is fully opened. Do not climb a closed stepladder.  Make sure that both sides of the stepladder are locked into place.  Ask someone to hold it for you, if possible.  Clearly mark and make sure that you can see:  Any grab bars or handrails.  First and last steps.  Where the edge of each step is.  Use tools that help you move around (mobility aids) if they are needed. These include:  Canes.  Walkers.  Scooters.  Crutches.  Turn on the lights when you go into a dark area. Replace any light bulbs as soon as they burn out.  Set up your furniture so you have a clear path. Avoid moving your furniture around.  If any of your floors are uneven, fix them.  If there are any pets around you, be aware of where they are.  Review your medicines with your doctor. Some medicines can make you feel dizzy. This can increase your chance of falling. Ask your doctor what other things that you can do to help prevent falls. This information is not intended to replace advice given to you by your health care provider. Make sure you discuss any questions you have with your health care provider. Document Released: 03/16/2009 Document Revised: 10/26/2015 Document Reviewed: 06/24/2014 Elsevier Interactive Patient Education  2017 [employer]

## 2019-03-17 NOTE — Assessment & Plan Note (Signed)
At goal per JNC 8.  Continue valsartan-HCTZ 1 60-25 once daily.  Check CBC CMET and TSH.

## 2019-03-17 NOTE — Patient Instructions (Signed)
It was very nice to see you today!  We will give you your flu vaccine today.  We will check blood work today.  You can try taking Voltaren gel for your hands.  You can also continue taking Tylenol and Aleve as needed.  Come back to see me in 1 year for your next physical, or sooner if needed.  Take care, Dr Jerline Pain  Please try these tips to maintain a healthy lifestyle:   Eat at least 3 REAL meals and 1-2 snacks per day.  Aim for no more than 5 hours between eating.  If you eat breakfast, please do so within one hour of getting up.    Obtain twice as many fruits/vegetables as protein or carbohydrate foods for both lunch and dinner. (Half of each meal should be fruits/vegetables, one quarter protein, and one quarter starchy carbs)   Cut down on sweet beverages. This includes juice, soda, and sweet tea.    Exercise at least 150 minutes every week.    Preventive Care 6 Years and Older, Female Preventive care refers to lifestyle choices and visits with your health care provider that can promote health and wellness. This includes:  A yearly physical exam. This is also called an annual well check.  Regular dental and eye exams.  Immunizations.  Screening for certain conditions.  Healthy lifestyle choices, such as diet and exercise. What can I expect for my preventive care visit? Physical exam Your health care provider will check:  Height and weight. These may be used to calculate body mass index (BMI), which is a measurement that tells if you are at a healthy weight.  Heart rate and blood pressure.  Your skin for abnormal spots. Counseling Your health care provider may ask you questions about:  Alcohol, tobacco, and drug use.  Emotional well-being.  Home and relationship well-being.  Sexual activity.  Eating habits.  History of falls.  Memory and ability to understand (cognition).  Work and work Statistician.  Pregnancy and menstrual history. What  immunizations do I need?  Influenza (flu) vaccine  This is recommended every year. Tetanus, diphtheria, and pertussis (Tdap) vaccine  You may need a Td booster every 10 years. Varicella (chickenpox) vaccine  You may need this vaccine if you have not already been vaccinated. Zoster (shingles) vaccine  You may need this after age 102. Pneumococcal conjugate (PCV13) vaccine  One dose is recommended after age 48. Pneumococcal polysaccharide (PPSV23) vaccine  One dose is recommended after age 63. Measles, mumps, and rubella (MMR) vaccine  You may need at least one dose of MMR if you were born in 1957 or later. You may also need a second dose. Meningococcal conjugate (MenACWY) vaccine  You may need this if you have certain conditions. Hepatitis A vaccine  You may need this if you have certain conditions or if you travel or work in places where you may be exposed to hepatitis A. Hepatitis B vaccine  You may need this if you have certain conditions or if you travel or work in places where you may be exposed to hepatitis B. Haemophilus influenzae type b (Hib) vaccine  You may need this if you have certain conditions. You may receive vaccines as individual doses or as more than one vaccine together in one shot (combination vaccines). Talk with your health care provider about the risks and benefits of combination vaccines. What tests do I need? Blood tests  Lipid and cholesterol levels. These may be checked every 5 years, or more  frequently depending on your overall health.  Hepatitis C test.  Hepatitis B test. Screening  Lung cancer screening. You may have this screening every year starting at age 43 if you have a 30-pack-year history of smoking and currently smoke or have quit within the past 15 years.  Colorectal cancer screening. All adults should have this screening starting at age 18 and continuing until age 4. Your health care provider may recommend screening at age 53 if  you are at increased risk. You will have tests every 1-10 years, depending on your results and the type of screening test.  Diabetes screening. This is done by checking your blood sugar (glucose) after you have not eaten for a while (fasting). You may have this done every 1-3 years.  Mammogram. This may be done every 1-2 years. Talk with your health care provider about how often you should have regular mammograms.  BRCA-related cancer screening. This may be done if you have a family history of breast, ovarian, tubal, or peritoneal cancers. Other tests  Sexually transmitted disease (STD) testing.  Bone density scan. This is done to screen for osteoporosis. You may have this done starting at age 70. Follow these instructions at home: Eating and drinking  Eat a diet that includes fresh fruits and vegetables, whole grains, lean protein, and low-fat dairy products. Limit your intake of foods with high amounts of sugar, saturated fats, and salt.  Take vitamin and mineral supplements as recommended by your health care provider.  Do not drink alcohol if your health care provider tells you not to drink.  If you drink alcohol: ? Limit how much you have to 0-1 drink a day. ? Be aware of how much alcohol is in your drink. In the [place] one drink equals one 12 oz bottle of beer (355 mL), one 5 oz glass of wine (148 mL), or one 1 oz glass of hard liquor (44 mL). Lifestyle  Take daily care of your teeth and gums.  Stay active. Exercise for at least 30 minutes on 5 or more days each week.  Do not use any products that contain nicotine or tobacco, such as cigarettes, e-cigarettes, and chewing tobacco. If you need help quitting, ask your health care provider.  If you are sexually active, practice safe sex. Use a condom or other form of protection in order to prevent STIs (sexually transmitted infections).  Talk with your health care provider about taking a low-dose aspirin or statin. What's next?   Go to your health care provider once a year for a well check visit.  Ask your health care provider how often you should have your eyes and teeth checked.  Stay up to date on all vaccines. This information is not intended to replace advice given to you by your health care provider. Make sure you discuss any questions you have with your health care provider. Document Released: 06/16/2015 Document Revised: 05/14/2018 Document Reviewed: 05/14/2018 Elsevier Patient Education  2020 [employer].

## 2019-03-17 NOTE — Progress Notes (Signed)
I have personally reviewed the Medicare Annual Wellness Visit and agree with the assessment and plan.  Algis Greenhouse. Jerline Pain, MD 03/17/2019 12:25 PM

## 2019-03-18 NOTE — Progress Notes (Signed)
Please inform patient of the following:  Her cholesterol levels are stable compared to last year. I would like for her to stay off her statin and we can recheck in a year.  All of her other labs are NORMAL. We can recheck in a year.  Stephanie Hunt. Jerline Pain, MD 03/18/2019 3:54 PM

## 2019-03-29 DIAGNOSIS — H40013 Open angle with borderline findings, low risk, bilateral: Secondary | ICD-10-CM | POA: Diagnosis not present

## 2019-04-08 DIAGNOSIS — H40013 Open angle with borderline findings, low risk, bilateral: Secondary | ICD-10-CM | POA: Diagnosis not present

## 2019-09-06 ENCOUNTER — Ambulatory Visit (INDEPENDENT_AMBULATORY_CARE_PROVIDER_SITE_OTHER): Payer: Medicare HMO | Admitting: Family Medicine

## 2019-09-06 ENCOUNTER — Other Ambulatory Visit: Payer: Self-pay

## 2019-09-06 ENCOUNTER — Encounter: Payer: Self-pay | Admitting: Family Medicine

## 2019-09-06 VITALS — BP 148/98 | HR 65 | Temp 98.5°F | Ht 63.0 in | Wt 138.6 lb

## 2019-09-06 DIAGNOSIS — M5432 Sciatica, left side: Secondary | ICD-10-CM | POA: Diagnosis not present

## 2019-09-06 DIAGNOSIS — I10 Essential (primary) hypertension: Secondary | ICD-10-CM | POA: Diagnosis not present

## 2019-09-06 MED ORDER — DICLOFENAC SODIUM 75 MG PO TBEC: 75.0000 mg | DELAYED_RELEASE_TABLET | Freq: Two times a day (BID) | ORAL | 0 refills | Status: DC

## 2019-09-06 NOTE — Patient Instructions (Signed)
It was very nice to see you today!  You have a pinched nerve in your back.  This should respond to anti-inflammatory within the next 1 to 2 weeks.  You can also use a heating pad as needed.  Let me know if not improving in the next 1 to 2 weeks.  Take care, Dr Jimmey Ralph  Please try these tips to maintain a healthy lifestyle:   Eat at least 3 REAL meals and 1-2 snacks per day.  Aim for no more than 5 hours between eating.  If you eat breakfast, please do so within one hour of getting up.    Each meal should contain half fruits/vegetables, one quarter protein, and one quarter carbs (no bigger than a computer mouse)   Cut down on sweet beverages. This includes juice, soda, and sweet tea.     Drink at least 1 glass of water with each meal and aim for at least 8 glasses per day   Exercise at least 150 minutes every week.

## 2019-09-06 NOTE — Progress Notes (Signed)
   Stephanie Hunt is a 85 y.o. female who presents today for an office visit.  Assessment/Plan:  New/Acute Problems: Sciatica No red flags.  Symptoms have been persistent for the past 3 weeks.  No need for imaging today.  We will start treatment with diclofenac 75 mg for the next couple weeks.  Can also use heating pad as well.  If no improvement next couple weeks would consider referral to sports med.  Chronic Problems Addressed Today: Hypertension Above goal in setting of acute pain.  We will continue valsartan 160-25 once daily.     Subjective:  HPI:  Started 3 weeks ago. Having pain and tingling from hip into left foot.  Was worse yesterday.  Tried taking Aleve with modest improvement.  Little bit better today.  No other obvious aggravating or alleviating factors.       Objective:  Physical Exam: BP (!) 148/98 (BP Location: Right Arm, Patient Position: Sitting, Cuff Size: Normal)   Pulse 65   Temp 98.5 F (36.9 C) (Temporal)   Ht 5\' 3"  (1.6 m)   Wt 138 lb 9.6 oz (62.9 kg)   SpO2 96%   BMI 24.55 kg/m   Gen: No acute distress, resting comfortably CV: Regular rate and rhythm with no murmurs appreciated Pulm: Normal work of breathing, clear to auscultation bilaterally with no crackles, wheezes, or rhonchi Neuro: Grossly normal, moves all extremities MSK: Bilateral extremities with full range of motion.  Positive straight leg raise on the left.  Neurovascular intact distally. Psych: Normal affect and thought content      Theophilus Walz M. , MD 09/06/2019 2:49 PM

## 2019-09-06 NOTE — Assessment & Plan Note (Signed)
Above goal in setting of acute pain.  We will continue valsartan 160-25 once daily.

## 2019-10-22 ENCOUNTER — Ambulatory Visit: Payer: Medicare HMO | Admitting: Family Medicine

## 2019-11-30 ENCOUNTER — Other Ambulatory Visit: Payer: Self-pay

## 2019-11-30 ENCOUNTER — Telehealth: Payer: Self-pay | Admitting: Family Medicine

## 2019-11-30 MED ORDER — DICLOFENAC SODIUM 75 MG PO TBEC: 75.0000 mg | DELAYED_RELEASE_TABLET | Freq: Two times a day (BID) | ORAL | 1 refills | Status: DC

## 2019-11-30 NOTE — Telephone Encounter (Signed)
Rx sent in Diclofenac Dr.Parker aware

## 2019-11-30 NOTE — Telephone Encounter (Signed)
Daughter Stanton Kidney [phone]   Patients daughter is calling - her leg is much worse and she is in a lot of pain.  Aleve isn't helping.  The medication he prescribed before helped ( patient threw the bottle away and they don't know what the medicine was - she thinks it was to help with the bursitis.) Leg goesn't appear to swollen or red - her main problem is the pain.  Willing to do a virtual visit or come into the office.  Dr Lavone Neri next available appt is 7/6 - they would like to see if there is any way they can be seen sooner.

## 2019-12-01 ENCOUNTER — Encounter: Payer: Self-pay | Admitting: Family Medicine

## 2019-12-01 ENCOUNTER — Telehealth (INDEPENDENT_AMBULATORY_CARE_PROVIDER_SITE_OTHER): Payer: Medicare HMO | Admitting: Physician Assistant

## 2019-12-01 ENCOUNTER — Other Ambulatory Visit: Payer: Self-pay

## 2019-12-01 ENCOUNTER — Ambulatory Visit (INDEPENDENT_AMBULATORY_CARE_PROVIDER_SITE_OTHER): Payer: Medicare HMO | Admitting: Family Medicine

## 2019-12-01 ENCOUNTER — Encounter: Payer: Self-pay | Admitting: Physician Assistant

## 2019-12-01 ENCOUNTER — Ambulatory Visit (INDEPENDENT_AMBULATORY_CARE_PROVIDER_SITE_OTHER): Payer: Medicare HMO

## 2019-12-01 ENCOUNTER — Telehealth: Payer: Self-pay | Admitting: Family Medicine

## 2019-12-01 VITALS — Ht 63.0 in | Wt 138.0 lb

## 2019-12-01 VITALS — BP 130/90 | HR 68 | Ht 63.0 in | Wt 137.0 lb

## 2019-12-01 DIAGNOSIS — M5432 Sciatica, left side: Secondary | ICD-10-CM | POA: Diagnosis not present

## 2019-12-01 DIAGNOSIS — M79605 Pain in left leg: Secondary | ICD-10-CM

## 2019-12-01 DIAGNOSIS — M545 Low back pain: Secondary | ICD-10-CM | POA: Diagnosis not present

## 2019-12-01 MED ORDER — AMBULATORY NON FORMULARY MEDICATION: 0 refills | Status: DC

## 2019-12-01 MED ORDER — PREDNISONE 10 MG PO TABS: 30.0000 mg | ORAL_TABLET | Freq: Every day | ORAL | 0 refills | Status: DC

## 2019-12-01 MED ORDER — GABAPENTIN 300 MG PO CAPS: 300.0000 mg | ORAL_CAPSULE | Freq: Three times a day (TID) | ORAL | 3 refills | Status: DC | PRN

## 2019-12-01 NOTE — Telephone Encounter (Signed)
Patient was evaluated.

## 2019-12-01 NOTE — Progress Notes (Signed)
Subjective:    CC: Low back and L leg pain  I, Stephanie Hunt, am serving as a Neurosurgeon for Dr. Clementeen Hunt.  HPI: Pt is a 85 y/o female w/ an acute exacerbation of low back and L leg pain from her thigh to her calf x few days.  She is also having numbness in her L foot.  She was seen previously for L leg pain in April by Dr. Jimmey Ralph and was prescribed oral diclofenac which helped.  Was in [place] 2 weeks ago walking a lot and noticed the pain was getting bad. Monday picked up a watermelon at the store and the pain was terrible. Rates pain a 10/10 and describes pain as sharp.   No weakness.  Patient notes that her leg is painful and interferes with walking but it is not actually weak.  She denies any bowel bladder dysfunction or groin numbness.  Radiating pain: yes fro L hip to L calf to L lateral foot  L LE numbness/tingling: L foot L LE weakness: states pain makes her feel like it is going to give out  Aggravating factors: walking Treatments tried: oral diclofenac heat   Pertinent review of Systems: No fevers or chills  Relevant historical information: Hypertension, hyperlipidemia.   Objective:    Vitals:   12/01/19 1344  BP: 130/90  Pulse: 68  SpO2: 97%   General: Well Developed, well nourished, and in no acute distress.   MSK: Spine normal.  Nontender.  Decreased range of motion to extension. Lower extremity strength reflexes and sensation are intact and equal bilateral lower extremities. Positive left-sided slump test.  Lab and Radiology Results  X-ray images left shoulder obtained today personally and independently reviewed DDD diffuse throughout lumbar spine worse at L5-S1.  No acute fractures. Await formal radiology review   Impression and Recommendations:    Assessment and Plan: 85 y.o. female with left leg sciatica.  Fortunately no new weakness groin numbness or bowel bladder dysfunction.  Plan to treat with prednisone and gabapentin.  If not  rapidly improving will proceed with lumbar MRI.  Patient symptoms are severe and abruptly worsened therefore MRI is reasonable if not rapidly improved.  Discussed warning signs or symptoms.[name]  PDMP not reviewed this encounter. Orders Placed This Encounter  Procedures  . DG Lumbar Spine 2-3 Views    Standing Status:   Future    Number of Occurrences:   1    Standing Expiration Date:   11/30/2020    Order Specific Question:   Reason for Exam (SYMPTOM  OR DIAGNOSIS REQUIRED)    Answer:   eval left sciatica    Order Specific Question:   Preferred imaging location?    Answer:   [facility]    Order Specific Question:   Radiology Contrast Protocol - do NOT remove file path    Answer:   [email or web address]   Meds ordered this encounter  Medications  . AMBULATORY NON FORMULARY MEDICATION    Sig: Rolling walker. Use as needed.  Disp 1 M54.32    Dispense:  1 each    Refill:  0  . predniSONE (DELTASONE) 10 MG tablet    Sig: Take 3 tablets (30 mg total) by mouth daily with breakfast.    Dispense:  15 tablet    Refill:  0  . gabapentin (NEURONTIN) 300 MG capsule    Sig: Take 1 capsule (300 mg total) by mouth 3 (three) times daily as needed (nerve pain).  Dispense:  90 capsule    Refill:  3    Discussed warning signs or symptoms. Please see discharge instructions. Patient expresses understanding.   The above documentation has been reviewed and is accurate and complete Stephanie Hunt, M.D.

## 2019-12-01 NOTE — Progress Notes (Signed)
Virtual Visit via Video   I connected with Stephanie Hunt on 12/01/19 at 12:30 PM EDT by a video enabled telemedicine application and verified that I am speaking with the correct person using two identifiers. Location patient: Home Location provider: [facility], Office Persons participating in the virtual visit: Stephanie Hunt, [place] PA-C, Stephanie Hunt, CMA   I discussed the limitations of evaluation and management by telemedicine and the availability of in person appointments. The patient expressed understanding and agreed to proceed.   I,Stephanie Hunt,acting as a Neurosurgeon for [employer], PA.,have documented all relevant documentation on the behalf of Stephanie Motto, PA,as directed by  Stephanie Motto, PA while in the presence of Stephanie Hunt, Stephanie Hunt.   Subjective:   HPI:   L leg pain Patient is having pain that starts in middle of back into left leg. Pain is in the side of her thigh and radiates down into her calf. She has had some numbness in foot. She had improvement when seen April by Dr. Jimmey Ralph -- was given oral diclofenac. Symptoms have faired up in the last few days and are more uncomfortable than before. She rates her pain a 10/10 and describes it as a sharp pain. Pain increased with walking and improves very little when she lays down. She has been taking diclofenac for one day.   No new onset bowel/bladder issues.  She is currently able to walk but pain is severe. Denies chest pain or shortness of breath.  She recently traveled to [place] from June 10-20 via airplane.  She states a few days at the grocery store she picked up a watermelon and turned the wrong way and ever since then her left leg pain and low back pain has flared up.  ROS: See pertinent positives and negatives per HPI.  Patient Active Problem List   Diagnosis Date Noted   Hypertension 04/15/2017   Hyperlipidemia 04/15/2017   Osteopenia 04/15/2017    Social History   Tobacco Use    Smoking status: Never Smoker   Smokeless tobacco: Never Used  Substance Use Topics   Alcohol use: No    Current Outpatient Medications:    Calcium Carbonate-Vitamin D (CALTRATE 600+D PO), Take by mouth., Disp: , Rfl:    diclofenac (VOLTAREN) 75 MG EC tablet, Take 1 tablet (75 mg total) by mouth 2 (two) times daily for 15 days., Disp: 30 tablet, Rfl: 1   Multiple Vitamin (MULTIVITAMIN) tablet, Take 1 tablet by mouth daily., Disp: , Rfl:    valsartan-hydrochlorothiazide (DIOVAN-HCT) 160-25 MG tablet, Take 1 tablet by mouth daily., Disp: 90 tablet, Rfl: 3  Allergies  Allergen Reactions   Penicillins Rash    Objective:   VITALS: Per patient if applicable, see vitals. GENERAL: Alert, appears well and in no acute distress. HEENT: Atraumatic, conjunctiva clear, no obvious abnormalities on inspection of external nose and ears. NECK: Normal movements of the head and neck. CARDIOPULMONARY: No increased WOB. Speaking in clear sentences. I:E ratio WNL.  MS: Moves all visible extremities without noticeable abnormality. PSYCH: Pleasant and cooperative, well-groomed. Speech normal rate and rhythm. Affect is appropriate. Insight and judgement are appropriate. Attention is focused, linear, and appropriate.  NEURO: CN grossly intact. Oriented as arrived to appointment on time with no prompting. Moves both UE equally.  SKIN: No obvious lesions, wounds, erythema, or cyanosis noted on face or hands.  Assessment and Plan:   Stephanie Hunt was seen today for leg pain.  Diagnoses and all orders for this visit:  Left leg  pain   Difficult to diagnose via video however I do think that she is dealing with it worsening flare of her left sciatic pain.  Due to the severity of her pain will refer her urgently to Dr. Clementeen Graham at sports medicine.  We were able to secure an appointment today, appreciate coordination of care.   Reviewed expectations re: course of current medical issues.  Discussed  self-management of symptoms.  Outlined signs and symptoms indicating need for more acute intervention.  Patient verbalized understanding and all questions were answered.  Health Maintenance issues including appropriate healthy diet, exercise, and smoking avoidance were discussed with patient.  See orders for this visit as documented in the electronic medical record.  I discussed the assessment and treatment plan with the patient. The patient was provided an opportunity to ask questions and all were answered. The patient agreed with the plan and demonstrated an understanding of the instructions.   The patient was advised to call back or seek an in-person evaluation if the symptoms worsen or if the condition fails to improve as anticipated.   CMA or LPN served as scribe during this visit. History, Physical, and Plan performed by medical provider. The above documentation has been reviewed and is accurate and complete.   [place], Stephanie Hunt 12/01/2019

## 2019-12-01 NOTE — Patient Instructions (Signed)
Thank you for coming in today. Plan for xray today.  STOP diclofenac.  Start gabapentin as needed for nerve pain. Mostly at night.  Start prednisone for 5 days.  If not improving or if worsening let me know and I will arrange for an MRI.    Sciatica  Sciatica is pain, weakness, tingling, or loss of feeling (numbness) along the sciatic nerve. The sciatic nerve starts in the lower back and goes down the back of each leg. Sciatica usually goes away on its own or with treatment. Sometimes, sciatica may come back (recur). What are the causes? This condition happens when the sciatic nerve is pinched or has pressure put on it. This may be the result of:  A disk in between the bones of the spine bulging out too far (herniated disk).  Changes in the spinal disks that occur with aging.  A condition that affects a muscle in the butt.  Extra bone growth near the sciatic nerve.  A break (fracture) of the area between your hip bones (pelvis).  Pregnancy.  Tumor. This is rare. What increases the risk? You are more likely to develop this condition if you:  Play sports that put pressure or stress on the spine.  Have poor strength and ease of movement (flexibility).  Have had a back injury in the past.  Have had back surgery.  Sit for long periods of time.  Do activities that involve bending or lifting over and over again.  Are very overweight (obese). What are the signs or symptoms? Symptoms can vary from mild to very bad. They may include:  Any of these problems in the lower back, leg, hip, or butt: ? Mild tingling, loss of feeling, or dull aches. ? Burning sensations. ? Sharp pains.  Loss of feeling in the back of the calf or the sole of the foot.  Leg weakness.  Very bad back pain that makes it hard to move. These symptoms may get worse when you cough, sneeze, or laugh. They may also get worse when you sit or stand for long periods of time. How is this treated? This  condition often gets better without any treatment. However, treatment may include:  Changing or cutting back on physical activity when you have pain.  Doing exercises and stretching.  Putting ice or heat on the affected area.  Medicines that help: ? To relieve pain and swelling. ? To relax your muscles.  Shots (injections) of medicines that help to relieve pain, irritation, and swelling.  Surgery. Follow these instructions at home: Medicines  Take over-the-counter and prescription medicines only as told by your doctor.  Ask your doctor if the medicine prescribed to you: ? Requires you to avoid driving or using heavy machinery. ? Can cause trouble pooping (constipation). You may need to take these steps to prevent or treat trouble pooping:  Drink enough fluids to keep your pee (urine) pale yellow.  Take over-the-counter or prescription medicines.  Eat foods that are high in fiber. These include beans, whole grains, and fresh fruits and vegetables.  Limit foods that are high in fat and sugar. These include fried or sweet foods. Managing pain      If told, put ice on the affected area. ? Put ice in a plastic bag. ? Place a towel between your skin and the bag. ? Leave the ice on for 20 minutes, 2-3 times a day.  If told, put heat on the affected area. Use the heat source that your doctor  tells you to use, such as a moist heat pack or a heating pad. ? Place a towel between your skin and the heat source. ? Leave the heat on for 20-30 minutes. ? Remove the heat if your skin turns bright red. This is very important if you are unable to feel pain, heat, or cold. You may have a greater risk of getting burned. Activity   Return to your normal activities as told by your doctor. Ask your doctor what activities are safe for you.  Avoid activities that make your symptoms worse.  Take short rests during the day. ? When you rest for a long time, do some physical activity or  stretching between periods of rest. ? Avoid sitting for a long time without moving. Get up and move around at least one time each hour.  Exercise and stretch regularly, as told by your doctor.  Do not lift anything that is heavier than 10 lb (4.5 kg) while you have symptoms of sciatica. ? Avoid lifting heavy things even when you do not have symptoms. ? Avoid lifting heavy things over and over.  When you lift objects, always lift in a way that is safe for your body. To do this, you should: ? Bend your knees. ? Keep the object close to your body. ? Avoid twisting. General instructions  Stay at a healthy weight.  Wear comfortable shoes that support your feet. Avoid wearing high heels.  Avoid sleeping on a mattress that is too soft or too hard. You might have less pain if you sleep on a mattress that is firm enough to support your back.  Keep all follow-up visits as told by your doctor. This is important. Contact a doctor if:  You have pain that: ? Wakes you up when you are sleeping. ? Gets worse when you lie down. ? Is worse than the pain you have had in the past. ? Lasts longer than 4 weeks.  You lose weight without trying. Get help right away if:  You cannot control when you pee (urinate) or poop (have a bowel movement).  You have weakness in any of these areas and it gets worse: ? Lower back. ? The area between your hip bones. ? Butt. ? Legs.  You have redness or swelling of your back.  You have a burning feeling when you pee. Summary  Sciatica is pain, weakness, tingling, or loss of feeling (numbness) along the sciatic nerve.  This condition happens when the sciatic nerve is pinched or has pressure put on it.  Sciatica can cause pain, tingling, or loss of feeling (numbness) in the lower back, legs, hips, and butt.  Treatment often includes rest, exercise, medicines, and putting ice or heat on the affected area. This information is not intended to replace advice  given to you by your health care provider. Make sure you discuss any questions you have with your health care provider. Document Revised: 06/08/2018 Document Reviewed: 06/08/2018 Elsevier Patient Education  2020 [employer].

## 2019-12-01 NOTE — Telephone Encounter (Signed)
Nurse Assessment Nurse: Anner Crete, RN, Olegario Messier Date/Time (Eastern Time): 12/01/2019 12:05:08 PM Confirm and document reason for call. If symptomatic, describe symptoms. ---Caller states her mother was having pain in her left leg yesterday and spoke with someone in the office. Voltaren was called in for her but her pain today is severe, 10/10, it is making it very difficult for her to walk. Has the patient had close contact with a person known or suspected to have the novel coronavirus illness OR traveled / lives in area with major community spread (including international travel) in the last 14 days from the onset of symptoms? * If Asymptomatic, screen for exposure and travel within the last 14 days. ---No Does the patient have any new or worsening symptoms? ---Yes Will a triage be completed? ---Yes Related visit to physician within the last 2 weeks? ---Yes Does the PT have any chronic conditions? (i.e. diabetes, asthma, this includes High risk factors for pregnancy, etc.) ---Yes List chronic conditions. ---HTN Is this a behavioral health or substance abuse call? ---NoPLEASE NOTE: All timestamps contained within this report are represented as [place] Standard Time. CONFIDENTIALTY NOTICE: This fax transmission is intended only for the addressee. It contains information that is legally privileged, confidential or otherwise protected from use or disclosure. If you are not the intended recipient, you are strictly prohibited from reviewing, disclosing, copying using or disseminating any of this information or taking any action in reliance on or regarding this information. If you have received this fax in error, please notify us immediately by telephone so that we can arrange for its return to [place]. Phone: [phone], Toll-Free: [phone], Fax: [phone] Page: 2 of 2 Call Id: [id number] Guidelines Guideline Title Affirmed Question Affirmed Notes Nurse Date/Time Lamount Cohen Time) Leg Pain [1]  SEVERE pain (e.g., excruciating, unable to do any normal activities) AND [2] not improved after 2 hours of pain medicine Anner Crete, RN, Olegario Messier 12/01/2019 12:07:52 PM Disp. Time Lamount Cohen Time) Disposition Final User 12/01/2019 12:13:11 PM See HCP within 4 Hours (or PCP triage) Yes Anner Crete, RN, Sammuel Bailiff Disagree/Comply Comply Caller Understands Yes PreDisposition Call Doctor Care Advice Given Per Guideline SEE HCP WITHIN 4 HOURS (OR PCP TRIAGE): * IF OFFICE WILL BE OPEN: You need to be seen within the next 3 or 4 hours. Call your doctor (or NP/PA) now or as soon as the office opens. CALL BACK IF: * You become worse. CARE ADVICE given per Leg Pain (Adult) guideline. Referrals Warm transfer to backline

## 2019-12-02 NOTE — Progress Notes (Signed)
Multilevel back arthritis present.

## 2019-12-10 ENCOUNTER — Telehealth: Payer: Self-pay | Admitting: Family Medicine

## 2019-12-10 MED ORDER — MELOXICAM 15 MG PO TABS: 7.5000 mg | ORAL_TABLET | Freq: Every day | ORAL | 0 refills | Status: DC | PRN

## 2019-12-10 NOTE — Telephone Encounter (Signed)
Left message for patient per Dr. Zollie Pee recommendations.

## 2019-12-10 NOTE — Telephone Encounter (Signed)
Patient called stating that her sister seems to have some of the same pain that she does and was prescribed Meloxicam 15mg . She asked if this is something that you think she would benefit from? The pain has eased a little bit but is still very uncomfortable.  Please advise.  Pharmacy: on [address]

## 2019-12-10 NOTE — Telephone Encounter (Signed)
We will try meloxicam. Once can have some risks including causing stomach irritation or kidney problems or elevated blood pressure so it is not a good long-term medication. Okay to take a little bit. Try taking a half a pill at a time and increasing to a full pill if needed.

## 2019-12-15 ENCOUNTER — Encounter: Payer: Self-pay | Admitting: Family Medicine

## 2019-12-15 ENCOUNTER — Telehealth (INDEPENDENT_AMBULATORY_CARE_PROVIDER_SITE_OTHER): Payer: Medicare HMO | Admitting: Family Medicine

## 2019-12-15 VITALS — BP 157/84 | Ht 63.0 in | Wt 128.0 lb

## 2019-12-15 DIAGNOSIS — M5432 Sciatica, left side: Secondary | ICD-10-CM

## 2019-12-15 DIAGNOSIS — M5430 Sciatica, unspecified side: Secondary | ICD-10-CM | POA: Insufficient documentation

## 2019-12-15 DIAGNOSIS — I10 Essential (primary) hypertension: Secondary | ICD-10-CM | POA: Diagnosis not present

## 2019-12-15 MED ORDER — VALSARTAN-HYDROCHLOROTHIAZIDE 160-25 MG PO TABS: 1.0000 | ORAL_TABLET | Freq: Every day | ORAL | 3 refills | Status: DC

## 2019-12-15 NOTE — Progress Notes (Signed)
   Stephanie Hunt is a 85 y.o. female who presents today for a telephone visit.  Assessment/Plan:  Chronic Problems Addressed Today: Sciatica, left side Advised her to take both meloxicam and gabapentin.  Advised her to take take these concurrently.  She will try this for a week or 2 then follow-up with sports medicine.  Hypertension Refilled valsartan-HCTZ 160-25 today.  Continue home monitoring goal 150/90 or lower.     Subjective:  HPI:  She needs a refill on BP medication today. She has been tolerating well without side effects.  She has recently been having sciatica on her left side and has been seeing sports medicine for this.  Symptoms have worsened recently.  She has not been taking gabapentin and meloxicam at the same time but has been alternating between the 2.       Objective/Observations   NAD  Telephone Visit   I connected with Stephanie Hunt on 12/15/19 at  8:20 AM EDT via telephone and verified that I am speaking with the correct person using two identifiers. I discussed the limitations of evaluation and management by telemedicine and the availability of in person appointments. The patient expressed understanding and agreed to proceed.   Patient location: Home Provider location: [facility] Persons participating in the virtual visit: Myself and Patient and her daughter  A total of 11 minutes were spent on medical discussion.      [name]. Jimmey Ralph, MD 12/15/2019 8:51 AM

## 2019-12-15 NOTE — Assessment & Plan Note (Signed)
Refilled valsartan-HCTZ 160-25 today.  Continue home monitoring goal 150/90 or lower.

## 2019-12-15 NOTE — Assessment & Plan Note (Signed)
Advised her to take both meloxicam and gabapentin.  Advised her to take take these concurrently.  She will try this for a week or 2 then follow-up with sports medicine.

## 2019-12-24 ENCOUNTER — Telehealth: Payer: Self-pay | Admitting: Family Medicine

## 2019-12-24 NOTE — Telephone Encounter (Signed)
Patient called to let Dr Denyse Amass know that she is walking much better and taking her medications.  She is pleased with her progress!

## 2019-12-31 ENCOUNTER — Telehealth: Payer: Self-pay | Admitting: Family Medicine

## 2019-12-31 NOTE — Progress Notes (Signed)
  Chronic Care Management   Outreach Note  12/31/2019 Name: Stephanie Hunt MRN: [id number] DOB: [birth year]  Referred by: Ardith Dark, MD Reason for referral : No chief complaint on file.   An unsuccessful telephone outreach was attempted today. The patient was referred to the pharmacist for assistance with care management and care coordination.   Follow Up Plan:   Lynnae January [facility] Scheduler

## 2019-12-31 NOTE — Progress Notes (Signed)
  Chronic Care Management   Note  12/31/2019 Name: Margaree Sandhu MRN: [id number] DOB: 25-Mar-1935  Vana Arif is a 85 y.o. year old female who is a primary care patient of Ardith Dark, MD. I reached out to [name] by phone today in response to a referral sent by Ms. Hillary Bow Minogue's PCP, Ardith Dark, MD.   Ms. Boyde was given information about Chronic Care Management services today including:  1. CCM service includes personalized support from designated clinical staff supervised by her physician, including individualized plan of care and coordination with other care providers 2. 24/7 contact phone numbers for assistance for urgent and routine care needs. 3. Service will only be billed when office clinical staff spend 20 minutes or more in a month to coordinate care. 4. Only one practitioner may furnish and bill the service in a calendar month. 5. The patient may stop CCM services at any time (effective at the end of the month) by phone call to the office staff.   Patient agreed to services and verbal consent obtained.   Follow up plan:   Lynnae January [facility] Scheduler

## 2020-01-07 ENCOUNTER — Telehealth: Payer: Self-pay | Admitting: Family Medicine

## 2020-01-07 NOTE — Telephone Encounter (Signed)
Patient has sciatica pain and states she didn't want injections to ease the pain. She wants to know if she needs to continue taking her medication and tylenol. She would like a call back-2           [phone]

## 2020-01-07 NOTE — Telephone Encounter (Signed)
Patient stating she wants a call back regarding injections.Patient wants a refill on Meloxicam

## 2020-01-08 ENCOUNTER — Other Ambulatory Visit: Payer: Self-pay | Admitting: Family Medicine

## 2020-01-10 ENCOUNTER — Telehealth: Payer: Self-pay | Admitting: Family Medicine

## 2020-01-10 MED ORDER — MELOXICAM 15 MG PO TABS: 7.5000 mg | ORAL_TABLET | Freq: Every day | ORAL | 0 refills | Status: DC | PRN

## 2020-01-10 NOTE — Telephone Encounter (Signed)
Patient called stating that she started using heat and Salonpas. Since then her pain has returned and it feels like it is burning on her left side from her hip to her thigh and down to her calf. It is causing her to have a hard time standing. She asked if she could have a refill on her Meloxicam 15mg  or what else she should do?  Please advise.

## 2020-01-10 NOTE — Telephone Encounter (Signed)
Okay to try meloxicam again.

## 2020-01-11 NOTE — Telephone Encounter (Signed)
Called pt and informed her that Meloxicam refill has been sent to her pharmacy.

## 2020-01-13 ENCOUNTER — Encounter: Payer: Self-pay | Admitting: Family Medicine

## 2020-01-13 ENCOUNTER — Other Ambulatory Visit: Payer: Self-pay

## 2020-01-13 ENCOUNTER — Ambulatory Visit: Payer: Medicare HMO | Admitting: Family Medicine

## 2020-01-13 VITALS — BP 130/88 | HR 80 | Ht 63.0 in | Wt 138.0 lb

## 2020-01-13 DIAGNOSIS — M5432 Sciatica, left side: Secondary | ICD-10-CM | POA: Diagnosis not present

## 2020-01-13 MED ORDER — PREDNISONE 50 MG PO TABS: 50.0000 mg | ORAL_TABLET | Freq: Every day | ORAL | 0 refills | Status: DC

## 2020-01-13 MED ORDER — LORAZEPAM 0.5 MG PO TABS: ORAL_TABLET | ORAL | 0 refills | Status: DC

## 2020-01-13 NOTE — Patient Instructions (Addendum)
Thank you for coming in today. Plan for MRI.  Redo prednisone.  Use atrivan sparingly prior to MRI for anxiety with MRI.  Recheck after the MRI.

## 2020-01-13 NOTE — Progress Notes (Signed)
   I, Christoper Fabian, LAT, ATC, am serving as scribe for Dr. Clementeen Graham.  Stephanie Hunt is a 85 y.o. female who presents to [facility] at [facility] today for f/u of low back pain and L leg pain.  She was last seen by Dr. Denyse Amass on 12/01/19 and was prescribed a short course of prednisone and Gabapentin.  Since her last visit, pt reports that her back and L leg pain is the same as before.  She reports a brief period of improvement after taking the medicine but pain has returned.  She reports new numbness/tingling in her L foot.  Diagnostic imaging: L-spine XR- 12/01/19  Pertinent review of systems: No fevers or chills  Relevant historical information: Osteopenia and hyperlipidemia   Exam:  BP 130/88 (BP Location: Left Arm, Patient Position: Sitting, Cuff Size: Normal)   Pulse 80   Ht 5\' 3"  (1.6 m)   Wt 138 lb (62.6 kg)   SpO2 96%   BMI 24.45 kg/m  General: Well Developed, well nourished, and in no acute distress.   MSK: Left leg intact sensation and strength intact    Lab and Radiology Results DG Lumbar Spine 2-3 Views  Result Date: 12/02/2019 CLINICAL DATA:  Low back pain with left-sided radicular type symptoms EXAM: LUMBAR SPINE - 2-3 VIEW COMPARISON:  None. FINDINGS: Frontal, lateral, and spot lumbosacral lateral images were obtained. There are 5 non-rib-bearing lumbar type vertebral bodies. There is no fracture or spondylolisthesis. There is moderately severe disc space narrowing at L3-4, L4-5, and L5-S1 with slightly milder disc space narrowing at L2-3. No erosive changes. There is facet arthropathy at L5-S1 bilaterally. There are anterior and right-sided osteophytes in the mid to lower lumbar region. IMPRESSION: Multilevel osteoarthritic change.  No fracture or spondylolisthesis. Electronically Signed   By: 02/02/2020 III M.D.   On: 12/02/2019 10:12   I, 02/02/2020, personally (independently) visualized and performed the interpretation of the images attached in  this note.      Assessment and Plan: 85 y.o. female with sciatica left leg.  Failure to significant previous prednisone and gabapentin.  Plan for MRI.  Represcribed course of prednisone as this did help release temporarily.  Use Ativan for anxiety with MRI.  Discussed risk of confusion or lethargy with Ativan.  Recheck after MRI.   PDMP not reviewed this encounter. No orders of the defined types were placed in this encounter.  Meds ordered this encounter  Medications  . predniSONE (DELTASONE) 50 MG tablet    Sig: Take 1 tablet (50 mg total) by mouth daily.    Dispense:  5 tablet    Refill:  0  . LORazepam (ATIVAN) 0.5 MG tablet    Sig: 1-2 tabs 30 - 60 min prior to MRI. Do not drive with this medicine.    Dispense:  4 tablet    Refill:  0     Discussed warning signs or symptoms. Please see discharge instructions. Patient expresses understanding.   The above documentation has been reviewed and is accurate and complete 97, M.D.

## 2020-01-18 NOTE — Progress Notes (Signed)
Chronic Care Management Pharmacy  Name: Stephanie Hunt  MRN: [id number] DOB: 23-Mar-1935  Chief Complaint/ HPI  Stephanie Hunt,  85 y.o. , female presents for their Initial CCM visit with the clinical pharmacist via telephone due to COVID-19 Pandemic.  Questions about price of walker with seat and whether upcoming MRI requested from Dr. Georgina Snell has been approved.  Had a fall yesterday due to some dizziness after getting sick from something she ate. Feeling much better today, blood pressure at home is 149/96.  Previously felt like arms got heavy while taking lovastatin.   PCP : Vivi Barrack, MD  Chronic conditions include:  Encounter Diagnoses  Name Primary?  . Hypertension, unspecified type Yes  . Hyperlipidemia, unspecified hyperlipidemia type   . Osteopenia, unspecified location     Office Visits:  12/15/2019 (PCP): Sciatica, meloxicam and gabapentin. 2 week follow up with SM.   Consult Visit: 01/13/2020 (Dr. Georgina Snell): SM. Reports new numbness/tingling in her left foot.   Patient Active Problem List   Diagnosis Date Noted  . Sciatica, left side 12/15/2019  . Hypertension 04/15/2017  . Hyperlipidemia 04/15/2017  . Osteopenia 04/15/2017   Past Surgical History:  Procedure Laterality Date  . WRIST FRACTURE SURGERY Bilateral    Social History   Socioeconomic History  . Marital status: Married    Spouse name: Not on file  . Number of children: Not on file  . Years of education: Not on file  . Highest education level: Not on file  Occupational History  . Occupation: retired  Tobacco Use  . Smoking status: Never Smoker  . Smokeless tobacco: Never Used  Vaping Use  . Vaping Use: Never used  Substance and Sexual Activity  . Alcohol use: No  . Drug use: No  . Sexual activity: Never  Other Topics Concern  . Not on file  Social History Narrative   Moved to area from [place] in 2018   Social Determinants of Health   Financial Resource Strain:   . Difficulty of Paying  Living Expenses:   Food Insecurity: No Food Insecurity  . Worried About Charity fundraiser in the Last Year: Never true  . Ran Out of Food in the Last Year: Never true  Transportation Needs: No Transportation Needs  . Lack of Transportation (Medical): No  . Lack of Transportation (Non-Medical): No  Physical Activity:   . Days of Exercise per Week:   . Minutes of Exercise per Session:   Stress:   . Feeling of Stress :   Social Connections:   . Frequency of Communication with Friends and Family:   . Frequency of Social Gatherings with Friends and Family:   . Attends Religious Services:   . Active Member of Clubs or Organizations:   . Attends Archivist Meetings:   [name] Marital Status:    Family History  Problem Relation Age of Onset  . Cancer Mother        Uterus  . Hypertension Father    Allergies  Allergen Reactions  . Penicillins Rash   Outpatient Encounter Medications as of 01/19/2020  Medication Sig  . Calcium Carbonate-Vitamin D (CALTRATE 600+D PO) Take by mouth.  . gabapentin (NEURONTIN) 300 MG capsule Take 1 capsule (300 mg total) by mouth 3 (three) times daily as needed (nerve pain).  . LORazepam (ATIVAN) 0.5 MG tablet 1-2 tabs 30 - 60 min prior to MRI. Do not drive with this medicine.  . meloxicam (MOBIC) 15 MG tablet Take 0.5-1 tablets (  7.5-15 mg total) by mouth daily as needed for pain.  . Multiple Vitamin (MULTIVITAMIN) tablet Take 1 tablet by mouth daily.  . valsartan-hydrochlorothiazide (DIOVAN-HCT) 160-25 MG tablet Take 1 tablet by mouth daily.  . predniSONE (DELTASONE) 50 MG tablet Take 1 tablet (50 mg total) by mouth daily.   No facility-administered encounter medications on file as of 01/19/2020.   Patient Care Team    Relationship Specialty Notifications Start End  Vivi Barrack, MD PCP - General Family Medicine  04/15/17   D[place], [place] Physician Optometry  03/17/19   Madelin Rear, [facility] Pharmacist Pharmacist  12/31/19    Comment:  [phone]   Current Diagnosis/Assessment: Goals Addressed            This Visit's Progress   . PharmD Care Plan       CARE PLAN ENTRY (see longitudinal plan of care for additional care plan information)  Current Barriers:  . Chronic Disease Management support, education, and care coordination needs related to Hypertension, Hyperlipidemia, and Osteopenia   Hypertension BP Readings from Last 3 Encounters:  01/13/20 130/88  12/15/19 (!) 157/84  12/01/19 130/90   . Pharmacist Clinical Goal(s): o Over the next 180 days, patient Stephanie work with PharmD and providers to maintain BP goal <150/90 . Current regimen:  o Valsartan-HCTZ 160-25 mg once daily . Interventions: o Home BP monitoring . Patient self care activities - Over the next 180 days, patient Stephanie: o Check BP at least once every one to weeks, document, and provide at future appointments o Ensure daily salt intake < 2300 mg/day  Hyperlipidemia Lab Results  Component Value Date/Time   LDLCALC 149 (H) 03/17/2019 09:58 AM   . Pharmacist Clinical Goal(s): o Over the next 180 days, patient Stephanie work with PharmD and providers to achieve LDL goal < 130 . Current regimen:  o No current medication, previously on statin . Interventions: o Diet  . Patient self care activities - Over the next 130 days, patient Stephanie: o Continue current management  Osteopenia . Pharmacist Clinical Goal(s) o Over the next 180 days, patient Stephanie work with PharmD and providers to minimize bone thinning through exercise as tolerated and appropriate vitamin D and calcium intake/supplementation  . Current regimen:  o Citrical  . Interventions: o Weight bearing exercises as tolerated . Patient self care activities - Over the next 180 days, patient Stephanie: o Continue current management  Medication management . Pharmacist Clinical Goal(s): o Over the next 180 days, patient Stephanie work with PharmD and providers to maintain optimal medication  adherence . Current pharmacy: [facility] . Interventions o Comprehensive medication review performed. o Utilize [facility] for medication synchronization, packaging and delivery . Patient self care activities - Over the next 180 days, patient Stephanie: o Take medications as prescribed o Report any questions or concerns to PharmD and/or provider(s)  Initial goal documentation.      Hypertension   BP goal 140/90.   BP Readings from Last 3 Encounters:  01/13/20 130/88  12/15/19 (!) 157/84  12/01/19 130/90   Patient has failed these meds in the past: n/a Patient checks BP at home 1-2x per week Patient home BP readings are ranging: 140s/90s  Patient is currently at goal on the following medications:  . Valsartan HCTZ 160-25 mg once daily  We discussed diet and exercise extensively.  Plan  Continue current medications.   Hyperlipidemia   LDL goal < 130  Lipid Panel     Component Value Date/Time  CHOL 244 (H) 03/17/2019 0958   TRIG 89.0 03/17/2019 0958   HDL 77.20 03/17/2019 0958   LDLCALC 149 (H) 03/17/2019 0958    Hepatic Function Latest Ref Rng & Units 03/17/2019  Total Protein 6.0 - 8.3 g/dL 6.8  Albumin 3.5 - 5.2 g/dL 4.2  AST 0 - 37 U/L 18  ALT 0 - 35 U/L 12  Alk Phosphatase 39 - 117 U/L 87  Total Bilirubin 0.2 - 1.2 mg/dL 0.6    The ASCVD Risk score ([place]., et al., 2013) failed to calculate for the following reasons:   The 2013 ASCVD risk score is only valid for ages 66 to 73   Muscle aches on lovastatin. Did not restart given age, stable lipids. Patient is currently above goal on the following medications:  . No current medications  We discussed:  diet and exercise extensively.  Plan  Continue control with diet and exercise.  Osteopenia   VITD  Date Value Ref Range Status  03/17/2019 47.49 30.00 - 100.00 ng/mL Final   Last DEXA Scan: 2019, osteopenia. Optimization of vitamin D and calcium. Recheck 04/2020. Patient is currently  controlled on the following medications:   Calcium Carbonate-Vitamin D (CALTRATE 600+D PO)  We discussed:  Recommend [phone] units of vitamin D daily. Recommend 1200 mg of calcium daily from dietary and supplemental sources. Recommend weight-bearing and muscle strengthening exercises for building and maintaining bone density.  Plan  Continue current medications.  Vaccines   Immunization History  Administered Date(s) Administered  . Fluad Quad(high Dose 65+) 03/17/2019  . Influenza, High Dose Seasonal PF 03/24/2018  . PFIZER SARS-COV-2 Vaccination 07/10/2019, 07/31/2019  . Zoster Recombinat (Shingrix) 03/17/2019, 07/02/2019   Reviewed and discussed patient's vaccination history.  Due for PNA.  Plan  Recommended patient receive PNA vaccine in office.   Medication Management / Care Coordination   Receives prescription medications from:  [facility] - [place], [place] - [address] [place] [address] Phone: [phone] Fax: [phone]   Would like to have MV, Ca/D and  medications packaged, synched, delivered to home.  [facility] w/ Dr Georgina Snell - contacted office, Stephanie order walker with seat, in process of ordering MRI. They Stephanie contact pt.   Preferred DME suppliers are [employer] and [employer]. [employer] said due to 20% pt resonsibilit, she would have to pay ~$40.  Pt informed and expresses understanding on all  the above.  Plan  Continue current medication management strategy.  ___________________________ SDOH (Social Determinants of Health) assessments performed: Yes. Future Appointments  Date Time Provider Adrian  07/26/2020  1:00 PM [facility] CCM PHARMACIST [facility]   Visit follow-up:  . CPA follow-up: med synch monthly call. [name] RPH follow-up: 6 month phone visit.  Madelin Rear, Pharm.D., BCGP Clinical Pharmacist [place] [phone]

## 2020-01-19 ENCOUNTER — Telehealth: Payer: Self-pay | Admitting: Family Medicine

## 2020-01-19 ENCOUNTER — Ambulatory Visit: Payer: Medicare HMO

## 2020-01-19 DIAGNOSIS — M5432 Sciatica, left side: Secondary | ICD-10-CM

## 2020-01-19 DIAGNOSIS — M8580 Other specified disorders of bone density and structure, unspecified site: Secondary | ICD-10-CM

## 2020-01-19 DIAGNOSIS — E785 Hyperlipidemia, unspecified: Secondary | ICD-10-CM

## 2020-01-19 DIAGNOSIS — I10 Essential (primary) hypertension: Secondary | ICD-10-CM

## 2020-01-19 MED ORDER — AMBULATORY NON FORMULARY MEDICATION
0 refills | Status: DC
Start: 2020-01-19 — End: 2023-11-29

## 2020-01-19 NOTE — Telephone Encounter (Signed)
MRI ordered.  I also cannot see an order.  I am pretty sure that I placed the order but cannot find it now.  We will have her go ahead and place a new order.  Prescription for walker ordered.  We will mail it to the patient.

## 2020-01-19 NOTE — Telephone Encounter (Signed)
The Pharmacist at [facility] called after meeting with this patient because she had a couple questions. He said that the patient wanted to know the status of her MRI. It does not look like anything has been ordered. Can this be put in for her? She also mentioned that she would like to have a walker with a seat. With [facility], they would prefer that it is sent to [facility].  Please advise.

## 2020-01-19 NOTE — Patient Instructions (Addendum)
Please call me at [phone] (direct line) with any questions - thank you!  - Bard Herbert., Clinical Pharmacist  Goals Addressed            This Visit's Progress   . PharmD Care Plan       CARE PLAN ENTRY (see longitudinal plan of care for additional care plan information)  Current Barriers:  . Chronic Disease Management support, education, and care coordination needs related to Hypertension, Hyperlipidemia, and Osteopenia   Hypertension BP Readings from Last 3 Encounters:  01/13/20 130/88  12/15/19 (!) 157/84  12/01/19 130/90   . Pharmacist Clinical Goal(s): o Over the next 180 days, patient will work with PharmD and providers to maintain BP goal <150/90 . Current regimen:  o Valsartan-HCTZ 160-25 mg once daily . Interventions: o Home BP monitoring . Patient self care activities - Over the next 180 days, patient will: o Check BP at least once every one to weeks, document, and provide at future appointments o Ensure daily salt intake < 2300 mg/day  Hyperlipidemia Lab Results  Component Value Date/Time   LDLCALC 149 (H) 03/17/2019 09:58 AM   . Pharmacist Clinical Goal(s): o Over the next 180 days, patient will work with PharmD and providers to achieve LDL goal < 130 . Current regimen:  o No current medication, previously on statin . Interventions: o Diet  . Patient self care activities - Over the next 130 days, patient will: o Continue current management  Osteopenia . Pharmacist Clinical Goal(s) o Over the next 180 days, patient will work with PharmD and providers to minimize bone thinning through exercise as tolerated and appropriate vitamin D and calcium intake/supplementation  . Current regimen:  o Citrical  . Interventions: o Weight bearing exercises as tolerated . Patient self care activities - Over the next 180 days, patient will: o Continue current management  Medication management . Pharmacist Clinical Goal(s): o Over the next 180 days, patient will work  with PharmD and providers to maintain optimal medication adherence . Current pharmacy: [facility] . Interventions o Comprehensive medication review performed. o Continue current medication management strategy . Patient self care activities - Over the next 180 days, patient will: o Take medications as prescribed o Report any questions or concerns to PharmD and/or provider(s)  Initial goal documentation.      Ms. Sullenger was given information about Chronic Care Management services today including:  1. CCM service includes personalized support from designated clinical staff supervised by her physician, including individualized plan of care and coordination with other care providers 2. 24/7 contact phone numbers for assistance for urgent and routine care needs. 3. Standard insurance, coinsurance, copays and deductibles apply for chronic care management only during months in which we provide at least 20 minutes of these services. Most insurances cover these services at 100%, however patients may be responsible for any copay, coinsurance and/or deductible if applicable. This service may help you avoid the need for more expensive face-to-face services. 4. Only one practitioner may furnish and bill the service in a calendar month. 5. The patient may stop CCM services at any time (effective at the end of the month) by phone call to the office staff.  Patient agreed to services and verbal consent obtained.   Verbal consent obtained for [facility] enhanced pharmacy services (medication synchronization, adherence packaging, delivery coordination). A medication sync plan was created to allow patient to get all medications delivered once every 30 to 90 days per patient preference. Patient understands they have freedom  to choose pharmacy and clinical pharmacist will coordinate care between all prescribers and [facility].  The patient verbalized understanding of instructions provided today and agreed  to receive a mailed copy of patient instruction and/or educational materials. Telephone follow up appointment with pharmacy team member scheduled for: See next appointment with "Care Management Staff" under "What's Next" below.   Dahlia Byes, Pharm.D., BCGP Clinical Pharmacist [facility] [phone]    Hypertension, Adult High blood pressure (hypertension) is when the force of blood pumping through the arteries is too strong. The arteries are the blood vessels that carry blood from the heart throughout the body. Hypertension forces the heart to work harder to pump blood and may cause arteries to become narrow or stiff. Untreated or uncontrolled hypertension can cause a heart attack, heart failure, a stroke, kidney disease, and other problems. A blood pressure reading consists of a higher number over a lower number. Ideally, your blood pressure should be below 120/80. The first ("top") number is called the systolic pressure. It is a measure of the pressure in your arteries as your heart beats. The second ("bottom") number is called the diastolic pressure. It is a measure of the pressure in your arteries as the heart relaxes. What are the causes? The exact cause of this condition is not known. There are some conditions that result in or are related to high blood pressure. What increases the risk? Some risk factors for high blood pressure are under your control. The following factors may make you more likely to develop this condition:  Smoking.  Having type 2 diabetes mellitus, high cholesterol, or both.  Not getting enough exercise or physical activity.  Being overweight.  Having too much fat, sugar, calories, or salt (sodium) in your diet.  Drinking too much alcohol. Some risk factors for high blood pressure may be difficult or impossible to change. Some of these factors include:  Having chronic kidney disease.  Having a family history  of high blood pressure.  Age. Risk increases with age.  Race. You may be at higher risk if you are African American.  Gender. Men are at higher risk than women before age 61. After age 50, women are at higher risk than men.  Having obstructive sleep apnea.  Stress. What are the signs or symptoms? High blood pressure may not cause symptoms. Very high blood pressure (hypertensive crisis) may cause:  Headache.  Anxiety.  Shortness of breath.  Nosebleed.  Nausea and vomiting.  Vision changes.  Severe chest pain.  Seizures. How is this diagnosed? This condition is diagnosed by measuring your blood pressure while you are seated, with your arm resting on a flat surface, your legs uncrossed, and your feet flat on the floor. The cuff of the blood pressure monitor will be placed directly against the skin of your upper arm at the level of your heart. It should be measured at least twice using the same arm. Certain conditions can cause a difference in blood pressure between your right and left arms. Certain factors can cause blood pressure readings to be lower or higher than normal for a short period of time:  When your blood pressure is higher when you are in a health care provider's office than when you are at home, this is called white coat hypertension. Most people with this condition do not need medicines.  When your blood pressure is higher at home than when you are in a health care provider's office, this  is called masked hypertension. Most people with this condition may need medicines to control blood pressure. If you have a high blood pressure reading during one visit or you have normal blood pressure with other risk factors, you may be asked to:  Return on a different day to have your blood pressure checked again.  Monitor your blood pressure at home for 1 week or longer. If you are diagnosed with hypertension, you may have other blood or imaging tests to help your health care  provider understand your overall risk for other conditions. How is this treated? This condition is treated by making healthy lifestyle changes, such as eating healthy foods, exercising more, and reducing your alcohol intake. Your health care provider may prescribe medicine if lifestyle changes are not enough to get your blood pressure under control, and if:  Your systolic blood pressure is above 130.  Your diastolic blood pressure is above 80. Your personal target blood pressure may vary depending on your medical conditions, your age, and other factors. Follow these instructions at home: Eating and drinking   Eat a diet that is high in fiber and potassium, and low in sodium, added sugar, and fat. An example eating plan is called the DASH (Dietary Approaches to Stop Hypertension) diet. To eat this way: ? Eat plenty of fresh fruits and vegetables. Try to fill one half of your plate at each meal with fruits and vegetables. ? Eat whole grains, such as whole-wheat pasta, brown rice, or whole-grain bread. Fill about one fourth of your plate with whole grains. ? Eat or drink low-fat dairy products, such as skim milk or low-fat yogurt. ? Avoid fatty cuts of meat, processed or cured meats, and poultry with skin. Fill about one fourth of your plate with lean proteins, such as fish, chicken without skin, beans, eggs, or tofu. ? Avoid pre-made and processed foods. These tend to be higher in sodium, added sugar, and fat.  Reduce your daily sodium intake. Most people with hypertension should eat less than 1,500 mg of sodium a day.  Do not drink alcohol if: ? Your health care provider tells you not to drink. ? You are pregnant, may be pregnant, or are planning to become pregnant.  If you drink alcohol: ? Limit how much you use to:  0-1 drink a day for women.  0-2 drinks a day for men. ? Be aware of how much alcohol is in your drink. In the [place] one drink equals one 12 oz bottle of beer (355 mL), one  5 oz glass of wine (148 mL), or one 1 oz glass of hard liquor (44 mL). Lifestyle   Work with your health care provider to maintain a healthy body weight or to lose weight. Ask what an ideal weight is for you.  Get at least 30 minutes of exercise most days of the week. Activities may include walking, swimming, or biking.  Include exercise to strengthen your muscles (resistance exercise), such as Pilates or lifting weights, as part of your weekly exercise routine. Try to do these types of exercises for 30 minutes at least 3 days a week.  Do not use any products that contain nicotine or tobacco, such as cigarettes, e-cigarettes, and chewing tobacco. If you need help quitting, ask your health care provider.  Monitor your blood pressure at home as told by your health care provider.  Keep all follow-up visits as told by your health care provider. This is important. Medicines  Take over-the-counter and prescription medicines  only as told by your health care provider. Follow directions carefully. Blood pressure medicines must be taken as prescribed.  Do not skip doses of blood pressure medicine. Doing this puts you at risk for problems and can make the medicine less effective.  Ask your health care provider about side effects or reactions to medicines that you should watch for. Contact a health care provider if you:  Think you are having a reaction to a medicine you are taking.  Have headaches that keep coming back (recurring).  Feel dizzy.  Have swelling in your ankles.  Have trouble with your vision. Get help right away if you:  Develop a severe headache or confusion.  Have unusual weakness or numbness.  Feel faint.  Have severe pain in your chest or abdomen.  Vomit repeatedly.  Have trouble breathing. Summary  Hypertension is when the force of blood pumping through your arteries is too strong. If this condition is not controlled, it may put you at risk for serious  complications.  Your personal target blood pressure may vary depending on your medical conditions, your age, and other factors. For most people, a normal blood pressure is less than 120/80.  Hypertension is treated with lifestyle changes, medicines, or a combination of both. Lifestyle changes include losing weight, eating a healthy, low-sodium diet, exercising more, and limiting alcohol. This information is not intended to replace advice given to you by your health care provider. Make sure you discuss any questions you have with your health care provider. Document Revised: 01/28/2018 Document Reviewed: 01/28/2018 Elsevier Patient Education  2020 [employer]

## 2020-01-30 ENCOUNTER — Other Ambulatory Visit: Payer: Self-pay

## 2020-01-30 ENCOUNTER — Ambulatory Visit (INDEPENDENT_AMBULATORY_CARE_PROVIDER_SITE_OTHER): Payer: Medicare HMO

## 2020-01-30 DIAGNOSIS — M545 Low back pain: Secondary | ICD-10-CM | POA: Diagnosis not present

## 2020-01-30 DIAGNOSIS — M5432 Sciatica, left side: Secondary | ICD-10-CM

## 2020-02-01 ENCOUNTER — Telehealth: Payer: Self-pay | Admitting: Family Medicine

## 2020-02-01 DIAGNOSIS — M5432 Sciatica, left side: Secondary | ICD-10-CM

## 2020-02-01 NOTE — Telephone Encounter (Signed)
Patient called regarding her MRI. I gave her Dr Zollie Pee response. She would like to proceed with the epidural injection. She is scheduled to follow up with Dr Denyse Amass on Friday.

## 2020-02-01 NOTE — Progress Notes (Signed)
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  Shows severe stenosis at left S1 nerve root.  This would cause sciatica.  Next step for this is an epidural steroid injection.  Recommend that you recheck with me in clinic today soon so that we can go over these results in detail.  If you would like I can go ahead and order the epidural steroid injection to get your leg pain better

## 2020-02-02 NOTE — Telephone Encounter (Signed)
Epidural steroid injection ordered to [facility] imaging.  Please notify [facility] to contact the patient.

## 2020-02-02 NOTE — Telephone Encounter (Signed)
Order routed to [place] at [facility] for her to contact the pt and schedule.

## 2020-02-04 ENCOUNTER — Encounter: Payer: Self-pay | Admitting: Family Medicine

## 2020-02-04 ENCOUNTER — Ambulatory Visit: Payer: Medicare HMO | Admitting: Family Medicine

## 2020-02-04 ENCOUNTER — Other Ambulatory Visit: Payer: Self-pay

## 2020-02-04 VITALS — BP 140/80 | HR 69 | Ht 63.0 in | Wt 138.0 lb

## 2020-02-04 DIAGNOSIS — M5432 Sciatica, left side: Secondary | ICD-10-CM | POA: Diagnosis not present

## 2020-02-04 NOTE — Progress Notes (Signed)
I, Christoper Fabian, LAT, ATC, am serving as scribe for Dr. Clementeen Graham.  Stephanie Hunt is a 85 y.o. female who presents to [facility] at [facility] today for f/u of low back pain and L leg pain and L-spine MRI review.  She was last seen by Dr. Denyse Amass on 01/13/20 and noted no change in her LBP and new numbness/tingling in her L foot.  She was prescribed a 2nd round of prednisone and referred for an L-spine MRI and L-spine epidural (scheduled of 02/09/20).  Since her last visit, pt reports that she is feeling a little better, noting that her pain is not as sharp.  She states that she is able to stand and walk briefly w/o her walker.  She states that the numbness/tinlging in her L foot has improved.  Diagnostic testing: L-spine MRI- 01/30/20; L-spine XR- 12/01/19   Pertinent review of systems: No fevers or chills  Relevant historical information: Does not take anticoagulation or blood thinners.   Exam:  BP 140/80 (BP Location: Left Arm, Patient Position: Sitting, Cuff Size: Normal)   Pulse 69   Ht 5\' 3"  (1.6 m)   Wt 138 lb (62.6 kg)   SpO2 98%   BMI 24.45 kg/m  General: Well Developed, well nourished, and in no acute distress.   MSK: L-spine nontender decreased motion.  Lower extremity strength is intact.    Lab and Radiology Results EXAM: MRI LUMBAR SPINE WITHOUT CONTRAST  TECHNIQUE: Multiplanar, multisequence MR imaging of the lumbar spine was performed. No intravenous contrast was administered.  COMPARISON:  Lumbar radiographs 12/01/2019  FINDINGS: Segmentation:  Normal.  Lowest disc space L5-S1  Alignment:  Normal sagittal alignment.  Vertebrae:  Normal bone marrow.  Negative for fracture or mass.  Conus medullaris and cauda equina: Conus extends to the L1-2 level. Conus and cauda equina appear normal.  Paraspinal and other soft tissues: Large right renal cyst. No retroperitoneal mass or adenopathy.  Disc levels:  T12-L1: Mild degenerative  change.  Negative for stenosis  L1-2: Mild disc and facet degeneration.  Negative for stenosis  L2-3: Moderate disc degeneration with disc space narrowing, Schmorl's nodes, and spurring asymmetric to the left. Bilateral facet hypertrophy. Mild spinal stenosis. Mild to moderate subarticular stenosis on the left.  L3-4: Disc degeneration with diffuse disc bulging, Schmorl's nodes, and endplate spurring. Bilateral facet hypertrophy. Moderate spinal stenosis with moderate subarticular stenosis bilaterally.  L4-5: Disc degeneration with disc bulging and endplate spurring. Moderate facet hypertrophy bilaterally. Moderate spinal stenosis and moderate subarticular and foraminal stenosis bilaterally.  L5-S1: Disc degeneration with Schmorl's node. Left-sided disc protrusion with severe subarticular stenosis on the left. Left S1 nerve root impingement due to disc protrusion, facet hypertrophy and possible small synovial cyst. Moderate spinal stenosis.  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 Multilevel spinal and subarticular stenosis as above  Severe subarticular stenosis on the left at L5-S1 with left S1 nerve root impingement due to disc protrusion, facet hypertrophy and possible small synovial cyst.   Electronically Signed   By: 12/03/2019 M.D.   On: 01/31/2020 08:55  I, 02/02/2020, personally (independently) visualized and performed the interpretation of the images attached in this note. Agree with radiology read.  Severe stenosis left L5-S1     Assessment and Plan: 85 y.o. female with left sciatica.  Patient has significant stenosis and neuroforaminal impingement at left S1 nerve root.  This corresponds to her pain quite well.  Epidural steroid injection has been ordered and  is scheduled for next week.  Spent time today discussing with the patient and her daughter MRI findings and epidural steroid injection plan and next steps if  needed.   Discussed warning signs or symptoms. Please see discharge instructions. Patient expresses understanding.   Total encounter time 20 minutes including face-to-face time with the patient and charting on the date of service.

## 2020-02-04 NOTE — Patient Instructions (Addendum)
Thank you for coming in today.  Plan for the back injection already scheduled.  We can repeat it if needed.   Keep me updated.  I am happy to see you anytime.

## 2020-02-09 ENCOUNTER — Ambulatory Visit
Admission: RE | Admit: 2020-02-09 | Discharge: 2020-02-09 | Disposition: A | Discharge status: Home / Self Care | Payer: Medicare HMO | Source: Ambulatory Visit | Attending: Family Medicine | Admitting: Family Medicine

## 2020-02-09 ENCOUNTER — Other Ambulatory Visit: Payer: Self-pay

## 2020-02-09 DIAGNOSIS — M47817 Spondylosis without myelopathy or radiculopathy, lumbosacral region: Secondary | ICD-10-CM | POA: Diagnosis not present

## 2020-02-09 DIAGNOSIS — M5432 Sciatica, left side: Secondary | ICD-10-CM

## 2020-02-09 MED ORDER — METHYLPREDNISOLONE ACETATE 40 MG/ML INJ SUSP (RADIOLOG
120.0000 mg | Freq: Once | INTRAMUSCULAR | Status: AC
  Administered 2020-02-09: 120 mg via EPIDURAL

## 2020-02-09 MED ORDER — IOPAMIDOL (ISOVUE-M 200) INJECTION 41%
1.0000 mL | Freq: Once | INTRAMUSCULAR | Status: AC
  Administered 2020-02-09: 1 mL via EPIDURAL

## 2020-02-09 NOTE — Discharge Instructions (Signed)

## 2020-02-23 ENCOUNTER — Telehealth: Payer: Self-pay | Admitting: Family Medicine

## 2020-02-23 NOTE — Telephone Encounter (Signed)
Glad she is feeling better.  Can repeat injection if needed in the future.

## 2020-02-23 NOTE — Telephone Encounter (Signed)
Pt wanted you to know that she had her epidural two weeks ago and feels so much better. She still has some pain but is no longer using her walker and is able to manage with her cane.

## 2020-03-02 ENCOUNTER — Telehealth: Payer: Self-pay

## 2020-03-02 NOTE — Progress Notes (Signed)
    Chronic Care Management Pharmacy Assistant   Name: Stephanie Hunt  MRN: [id number] DOB: [birth year]  Reason for Encounter: Medication Review   PCP : Ardith Dark, MD  Allergies:   Allergies  Allergen Reactions  . Penicillins Rash    Medications: Outpatient Encounter Medications as of 03/02/2020  Medication Sig  . AMBULATORY NON FORMULARY MEDICATION Walker with seat.  Disp 1.  Try using aerocare Sciatica M54.32  . Calcium Carbonate-Vitamin D (CALTRATE 600+D PO) Take by mouth.  . gabapentin (NEURONTIN) 300 MG capsule Take 1 capsule (300 mg total) by mouth 3 (three) times daily as needed (nerve pain).  . LORazepam (ATIVAN) 0.5 MG tablet 1-2 tabs 30 - 60 min prior to MRI. Do not drive with this medicine.  . meloxicam (MOBIC) 15 MG tablet Take 0.5-1 tablets (7.5-15 mg total) by mouth daily as needed for pain.  . Multiple Vitamin (MULTIVITAMIN) tablet Take 1 tablet by mouth daily.  . valsartan-hydrochlorothiazide (DIOVAN-HCT) 160-25 MG tablet Take 1 tablet by mouth daily.   No facility-administered encounter medications on file as of 03/02/2020.    Current Diagnosis: Patient Active Problem List   Diagnosis Date Noted  . Sciatica, left side 12/15/2019  . Hypertension 04/15/2017  . Hyperlipidemia 04/15/2017  . Osteopenia 04/15/2017    Reviewed chart for medication changes ahead of medication coordination call.  No OVs, Consults, or hospital visits since last care coordination call/Pharmacist visit. (If appropriate, list visit date, provider name)  No medication changes indicated OR if recent visit, treatment plan here.  BP Readings from Last 3 Encounters:  02/09/20 (!) 184/83  02/04/20 140/80  01/13/20 130/88    No results found for: HGBA1C   Patient obtains medications through Adherence Packaging  30 Days   Patient is due for next adherence delivery on: 03-10-2020. Called patient and reviewed medications and coordinated delivery.  This delivery to  include:  Calcium Carbonate 600mg - Vitamin D3 400 unit Take one tab every morning and take one tab every evening Women's one Daily Multivitamin 50+ Take one tab every morning Valsartan 160mg - Hydrochlorothiazide 25mg  Tab Take one tab every morning  Confirmed delivery date of 03-10-20, advised patient that pharmacy will contact them the morning of delivery.   ,CMA Clinical Pharmacist Assistant [phone]     Follow-Up:  Pharmacist Review

## 2020-03-06 ENCOUNTER — Telehealth: Payer: Self-pay | Admitting: Family Medicine

## 2020-03-06 NOTE — Telephone Encounter (Signed)
Patient called stating that since she had her epidural she has had extreme stomach pain and frequently feels very sick. She did not know if this could be related? She has been walking much better but noticed that she has had pain in her upper thigh so she started taking Diclofenac 75mg .  She also asked if she should be doing any exercises?  Please advise.

## 2020-03-06 NOTE — Telephone Encounter (Signed)
I do not think the stomach pain has much to do with the epidural but it could be due to the diclofenac pills that you are taking.  That can cause stomach ulcers.  Recommend stopping the diclofenac and starting an antacid medicine such as omeprazole which is over-the-counter Prilosec.  Feel free to schedule follow-up appointment with me soon or with your PCP to evaluate for stomach pain.

## 2020-03-06 NOTE — Telephone Encounter (Signed)
Pt called back, she has trouble with her phone at times. I read her the response from Dr. Denyse Amass, but noted she also asked about the pain inn her upper thigh and exercising. Please call back with that info.

## 2020-03-06 NOTE — Telephone Encounter (Signed)
Attempted to call pt.  Call was picked up but no one spoke.

## 2020-03-07 NOTE — Telephone Encounter (Signed)
That could be coming from the hip or the muscles in the thigh.  If bothersome reasonable to schedule follow-up appointment with me in the near future.

## 2020-03-07 NOTE — Telephone Encounter (Signed)
Spoke to patient. She is going to check with her daughter (who has to bring her) and she will call back to schedule.

## 2020-03-10 ENCOUNTER — Ambulatory Visit: Payer: Self-pay

## 2020-03-10 ENCOUNTER — Other Ambulatory Visit: Payer: Self-pay

## 2020-03-10 ENCOUNTER — Ambulatory Visit: Payer: Medicare HMO | Admitting: Family Medicine

## 2020-03-10 ENCOUNTER — Encounter: Payer: Self-pay | Admitting: Family Medicine

## 2020-03-10 VITALS — BP 110/70 | HR 75 | Ht 63.0 in | Wt 135.2 lb

## 2020-03-10 DIAGNOSIS — M7062 Trochanteric bursitis, left hip: Secondary | ICD-10-CM | POA: Diagnosis not present

## 2020-03-10 DIAGNOSIS — M5432 Sciatica, left side: Secondary | ICD-10-CM

## 2020-03-10 DIAGNOSIS — M79652 Pain in left thigh: Secondary | ICD-10-CM

## 2020-03-10 NOTE — Patient Instructions (Addendum)
Thank you for coming in today.  Please use voltaren gel up to 4x daily for pain as needed.   I've referred you to Physical Therapy.  Let us know if you don't hear from them in one week.  Recheck in 8 weeks or so. Return sooner if needed.    Hip Bursitis  Hip bursitis is swelling of a fluid-filled sac (bursa) in your hip joint. This swelling (inflammation) can be painful. This condition may come and go over time. What are the causes?  Injury to the hip.  Overuse of the muscles that surround the hip joint.  An earlier injury or surgery of the hip.  Arthritis or gout.  Diabetes.  Thyroid disease.  Infection.  In some cases, the cause may not be known. What are the signs or symptoms?  Mild or moderate pain in the hip area. Pain may get worse with movement.  Tenderness and swelling of the hip, especially on the outer side of the hip.  In rare cases, the bursa may become infected. This may cause: ? A fever. ? Warmth and redness in the area. Symptoms may come and go. How is this treated? This condition is treated by resting, icing, applying pressure (compression), and raising (elevating) the injured area. You may hear this called the RICE treatment. Treatment may also include:  Using crutches.  Draining fluid out of the bursa to help relieve swelling.  Giving a shot of (injecting) medicine that helps to reduce swelling (cortisone).  Other medicines if the bursa is infected. Follow these instructions at home: Managing pain, stiffness, and swelling   If told, put ice on the painful area. ? Put ice in a plastic bag. ? Place a towel between your skin and the bag. ? Leave the ice on for 20 minutes, 2-3 times a day. ? Raise (elevate) your hip above the level of your heart as much as you can without pain. To do this, try putting a pillow under your hips while you lie down. Stop if this causes pain. Activity  Return to your normal activities as told by your doctor. Ask  your doctor what activities are safe for you.  Rest and protect your hip as much as you can until you feel better. General instructions  Take over-the-counter and prescription medicines only as told by your doctor.  Wear wraps that put pressure on your hip (compression wraps) only as told by your doctor.  Do not use your hip to support your body weight until your doctor says that you can.  Use crutches as told by your doctor.  Gently rub and stretch your injured area as often as is comfortable.  Keep all follow-up visits as told by your doctor. This is important. How is this prevented?  Exercise regularly, as told by your doctor.  Warm up and stretch before being active.  Cool down and stretch after being active.  Avoid activities that bother your hip or cause pain.  Avoid sitting down for long periods at a time. Contact a doctor if:  You have a fever.  You get new symptoms.  You have trouble walking.  You have trouble doing everyday activities.  You have pain that gets worse.  You have pain that does not get better with medicine.  You get red skin on your hip area.  You get a feeling of warmth in your hip area. Get help right away if:  You cannot move your hip.  You have very bad pain. Summary  Hip bursitis is swelling of a fluid-filled sac (bursa) in your hip.  Hip bursitis can be painful.  Symptoms often come and go over time.  This condition is treated with rest, ice, compression, elevation, and medicines. This information is not intended to replace advice given to you by your health care provider. Make sure you discuss any questions you have with your health care provider. Document Revised: 01/26/2018 Document Reviewed: 01/26/2018 Elsevier Patient Education  2020 [employer].

## 2020-03-10 NOTE — Progress Notes (Signed)
   I, Christoper Fabian, LAT, ATC, am serving as scribe for Dr. Clementeen Graham.  Stephanie Hunt is a 85 y.o. female who presents to [facility] at [facility] today for f/u of LBP and L LE/hip pain.  She was last seen by Dr. Denyse Amass on 02/04/20 and noted improved paresthesias in her L foot w/ con't LBP and L leg pain.  She noted some mild improvement in her ability to walk, stating that she was able to walk for brief periods of time w/o her walker.  Since her last visit, pt had an ESI (L L5-S1) on 02/09/20.  Today, she reports that she con't to have L lateral thigh pain.  She is walking better and not having to use her walker at this point.  She will intermittently use a cane.  She denies any paresthesias in her L LE.  Diagnostic testing: L-spine MRI- 01/30/20; L-spine XR- 12/01/19   Pertinent review of systems: No fevers or chills  Relevant historical information: Hypertension   Exam:  BP 110/70 (BP Location: Right Arm, Patient Position: Sitting, Cuff Size: Normal)   Pulse 75   Ht 5\' 3"  (1.6 m)   Wt 135 lb 3.2 oz (61.3 kg)   SpO2 97%   BMI 23.95 kg/m  General: Well Developed, well nourished, and in no acute distress.   MSK: Left hip normal-appearing Tender palpation greater trochanter. Hip abduction strength and external rotation strength diminished 4/5 with pain. Antalgic gait using a cane to ambulate.    Lab and Radiology Results  MRI lumbar spine January 30, 2020 IMPRESSION: Multilevel degenerative change in the lumbar spine. Multilevel spinal and subarticular stenosis as above  Severe subarticular stenosis on the left at L5-S1 with left S1 nerve root impingement due to disc protrusion, facet hypertrophy and possible small synovial cyst.   Electronically Signed   By: February 01, 2020 M.D.   On: 01/31/2020 08:55 I, 02/02/2020, personally (independently) visualized and performed the interpretation of the images attached in this note.     Assessment and Plan: 85 y.o.  female with left lateral hip pain mostly due to trochanteric bursitis and hip abductor tendinopathy.  There probably is some component of L5 radiculopathy as well.  The S1 radiculopathy that was treated with epidural steroid injection on September 8 this is mostly resolved.  Plan for home health physical therapy.  Consider steroid injections in the future for this.  She had abdominal pain following epidural steroid injection thought to be possibly due to gastritis.  Will prescribe PPI if planning for future steroid injection.  Recheck in 8 weeks or so.  Left lateral hip pain is a new problem.  Sciatica/spinal stenosis is a chronic problem.   Orders Placed This Encounter  Procedures  . Ambulatory referral to Home Health    Referral Priority:   Routine    Referral Type:   Home Health Care    Referral Reason:   Specialty Services Required    Requested Specialty:   Home Health Services    Number of Visits Requested:   1   No orders of the defined types were placed in this encounter.    Discussed warning signs or symptoms. Please see discharge instructions. Patient expresses understanding.   The above documentation has been reviewed and is accurate and complete 07-19-1989, M.D.

## 2020-03-22 ENCOUNTER — Other Ambulatory Visit: Payer: Self-pay

## 2020-03-22 ENCOUNTER — Encounter: Payer: Self-pay | Admitting: Family Medicine

## 2020-03-22 ENCOUNTER — Ambulatory Visit (INDEPENDENT_AMBULATORY_CARE_PROVIDER_SITE_OTHER): Payer: Medicare HMO

## 2020-03-22 DIAGNOSIS — Z23 Encounter for immunization: Secondary | ICD-10-CM

## 2020-03-23 DIAGNOSIS — M7062 Trochanteric bursitis, left hip: Secondary | ICD-10-CM | POA: Diagnosis not present

## 2020-03-23 DIAGNOSIS — E785 Hyperlipidemia, unspecified: Secondary | ICD-10-CM | POA: Diagnosis not present

## 2020-03-23 DIAGNOSIS — M5117 Intervertebral disc disorders with radiculopathy, lumbosacral region: Secondary | ICD-10-CM | POA: Diagnosis not present

## 2020-03-23 DIAGNOSIS — M8580 Other specified disorders of bone density and structure, unspecified site: Secondary | ICD-10-CM | POA: Diagnosis not present

## 2020-03-23 DIAGNOSIS — Z9181 History of falling: Secondary | ICD-10-CM | POA: Diagnosis not present

## 2020-03-23 DIAGNOSIS — I10 Essential (primary) hypertension: Secondary | ICD-10-CM | POA: Diagnosis not present

## 2020-03-27 ENCOUNTER — Telehealth: Payer: Self-pay

## 2020-03-27 NOTE — Progress Notes (Signed)
    Chronic Care Management Pharmacy Assistant   Name: Stephanie Hunt  MRN: [id number] DOB: 04/30/1935  Reason for Encounter: Medication Review    PCP : Ardith Dark, MD  Allergies:   Allergies  Allergen Reactions  . Penicillins Rash    Medications: Outpatient Encounter Medications as of 03/27/2020  Medication Sig  . AMBULATORY NON FORMULARY MEDICATION Walker with seat.  Disp 1.  Try using aerocare Sciatica M54.32  . Calcium Carbonate-Vitamin D (CALTRATE 600+D PO) Take by mouth.  . gabapentin (NEURONTIN) 300 MG capsule Take 1 capsule (300 mg total) by mouth 3 (three) times daily as needed (nerve pain).  . LORazepam (ATIVAN) 0.5 MG tablet 1-2 tabs 30 - 60 min prior to MRI. Do not drive with this medicine.  . meloxicam (MOBIC) 15 MG tablet Take 0.5-1 tablets (7.5-15 mg total) by mouth daily as needed for pain.  . Multiple Vitamin (MULTIVITAMIN) tablet Take 1 tablet by mouth daily.  . valsartan-hydrochlorothiazide (DIOVAN-HCT) 160-25 MG tablet Take 1 tablet by mouth daily.   No facility-administered encounter medications on file as of 03/27/2020.    Current Diagnosis: Patient Active Problem List   Diagnosis Date Noted  . Trochanteric bursitis of left hip 03/10/2020  . Sciatica, left side 12/15/2019  . Hypertension 04/15/2017  . Hyperlipidemia 04/15/2017  . Osteopenia 04/15/2017    Reviewed chart for medication changes ahead of medication coordination call.  No OVs, Consults, or hospital visits since last care coordination call/Pharmacist visit. (If appropriate, list visit date, provider name)  No medication changes indicated OR if recent visit, treatment plan here.  BP Readings from Last 3 Encounters:  03/10/20 110/70  02/09/20 (!) 184/83  02/04/20 140/80    No results found for: HGBA1C   Patient obtains medications through Adherence Packaging  30 Days   Patient is due for next adherence delivery on: 04-10-2020. Called patient and reviewed medications.  This  delivery to include:   Valsartan 160 mg - Hydrochlorothiazide 25 mg tablet one tablet by mouth every morning.    Patient declined the following medications:   Women's Multivitamin 50+ one tablet by mouth every morning.  Calcium Carbonate 600 mg (1,500 mg) - Vitamin D3 400 unit tablet one tablet by mouth every morning and every evening.    Patient states she is declining medications Calcium Carbonate and Women's Multivitamin due to stomach irritation that started in July. Patient states stomach irritation maybe caused by Calcium Carbonate and taking too many medications at a time. Patient states she is taking Calcium Carbonate and Women's Multivitamin every other day to possibly prevent stomach irritation and she has a lot of medications left over.  Patient would like to know if she can take Calcium Carbonate once a day instead of twice a day? Informed her that Gerilyn Pilgrim will get back to her.  Confirmed delivery date of,04-10-2020 advised patient that pharmacy will contact them the morning of delivery.  [name] ,CMA Clinical Pharmacist Assistant [phone]  Follow-Up:  Pharmacist Review

## 2020-03-28 ENCOUNTER — Telehealth: Payer: Self-pay

## 2020-03-28 DIAGNOSIS — M8580 Other specified disorders of bone density and structure, unspecified site: Secondary | ICD-10-CM | POA: Diagnosis not present

## 2020-03-28 DIAGNOSIS — E785 Hyperlipidemia, unspecified: Secondary | ICD-10-CM | POA: Diagnosis not present

## 2020-03-28 DIAGNOSIS — Z9181 History of falling: Secondary | ICD-10-CM | POA: Diagnosis not present

## 2020-03-28 DIAGNOSIS — I10 Essential (primary) hypertension: Secondary | ICD-10-CM | POA: Diagnosis not present

## 2020-03-28 DIAGNOSIS — M5117 Intervertebral disc disorders with radiculopathy, lumbosacral region: Secondary | ICD-10-CM | POA: Diagnosis not present

## 2020-03-28 DIAGNOSIS — M7062 Trochanteric bursitis, left hip: Secondary | ICD-10-CM | POA: Diagnosis not present

## 2020-03-28 NOTE — Progress Notes (Signed)
Patient called regarding her medication delivery. Patient stated she actually does need her woman's Vitamins and Calcium Carbonate included in her delivery . Informed patient that we will include these medications into her delivery.    Reviewed chart for medication changes ahead of medication coordination call.  No OVs, Consults, or hospital visits since last care coordination call/Pharmacist visit. (If appropriate, list visit date, provider name)  No medication changes indicated OR if recent visit, treatment plan here.  BP Readings from Last 3 Encounters:  03/10/20 110/70  02/09/20 (!) 184/83  02/04/20 140/80    No results found for: HGBA1C   Patient obtains medications through Adherence Packaging  30 Days    Patient is due for next adherence delivery on: 04-10-2020 Called patient and reviewed medications and coordinated delivery.  This delivery to include: Calcium Carbonate 600mg - Vitamin D3 400 unit Take one tab every morning   Women's one Daily Multivitamin 50+ Take one tab every morning  Valsartan 160mg - Hydrochlorothiazide 25mg  Tab Take one tab every morning   Confirmed delivery date of 04-10-2020, advised patient that pharmacy will contact them the morning of delivery.  ,[facility] Clinical Pharmacist Assistant [phone]

## 2020-03-30 DIAGNOSIS — E785 Hyperlipidemia, unspecified: Secondary | ICD-10-CM | POA: Diagnosis not present

## 2020-03-30 DIAGNOSIS — I10 Essential (primary) hypertension: Secondary | ICD-10-CM | POA: Diagnosis not present

## 2020-03-30 DIAGNOSIS — Z9181 History of falling: Secondary | ICD-10-CM | POA: Diagnosis not present

## 2020-03-30 DIAGNOSIS — M8580 Other specified disorders of bone density and structure, unspecified site: Secondary | ICD-10-CM | POA: Diagnosis not present

## 2020-03-30 DIAGNOSIS — M7062 Trochanteric bursitis, left hip: Secondary | ICD-10-CM | POA: Diagnosis not present

## 2020-03-30 DIAGNOSIS — M5117 Intervertebral disc disorders with radiculopathy, lumbosacral region: Secondary | ICD-10-CM | POA: Diagnosis not present

## 2020-04-04 DIAGNOSIS — M7062 Trochanteric bursitis, left hip: Secondary | ICD-10-CM | POA: Diagnosis not present

## 2020-04-04 DIAGNOSIS — M8580 Other specified disorders of bone density and structure, unspecified site: Secondary | ICD-10-CM | POA: Diagnosis not present

## 2020-04-04 DIAGNOSIS — Z9181 History of falling: Secondary | ICD-10-CM | POA: Diagnosis not present

## 2020-04-04 DIAGNOSIS — E785 Hyperlipidemia, unspecified: Secondary | ICD-10-CM | POA: Diagnosis not present

## 2020-04-04 DIAGNOSIS — I10 Essential (primary) hypertension: Secondary | ICD-10-CM | POA: Diagnosis not present

## 2020-04-04 DIAGNOSIS — M5117 Intervertebral disc disorders with radiculopathy, lumbosacral region: Secondary | ICD-10-CM | POA: Diagnosis not present

## 2020-04-10 DIAGNOSIS — I10 Essential (primary) hypertension: Secondary | ICD-10-CM | POA: Diagnosis not present

## 2020-04-10 DIAGNOSIS — M5117 Intervertebral disc disorders with radiculopathy, lumbosacral region: Secondary | ICD-10-CM | POA: Diagnosis not present

## 2020-04-10 DIAGNOSIS — E785 Hyperlipidemia, unspecified: Secondary | ICD-10-CM | POA: Diagnosis not present

## 2020-04-10 DIAGNOSIS — M8580 Other specified disorders of bone density and structure, unspecified site: Secondary | ICD-10-CM | POA: Diagnosis not present

## 2020-04-10 DIAGNOSIS — M7062 Trochanteric bursitis, left hip: Secondary | ICD-10-CM | POA: Diagnosis not present

## 2020-04-10 DIAGNOSIS — Z9181 History of falling: Secondary | ICD-10-CM | POA: Diagnosis not present

## 2020-04-19 DIAGNOSIS — M5117 Intervertebral disc disorders with radiculopathy, lumbosacral region: Secondary | ICD-10-CM | POA: Diagnosis not present

## 2020-04-19 DIAGNOSIS — M8580 Other specified disorders of bone density and structure, unspecified site: Secondary | ICD-10-CM | POA: Diagnosis not present

## 2020-04-19 DIAGNOSIS — M7062 Trochanteric bursitis, left hip: Secondary | ICD-10-CM | POA: Diagnosis not present

## 2020-04-19 DIAGNOSIS — Z9181 History of falling: Secondary | ICD-10-CM | POA: Diagnosis not present

## 2020-04-19 DIAGNOSIS — I10 Essential (primary) hypertension: Secondary | ICD-10-CM | POA: Diagnosis not present

## 2020-04-19 DIAGNOSIS — E785 Hyperlipidemia, unspecified: Secondary | ICD-10-CM | POA: Diagnosis not present

## 2020-04-20 ENCOUNTER — Ambulatory Visit (INDEPENDENT_AMBULATORY_CARE_PROVIDER_SITE_OTHER): Payer: Medicare HMO

## 2020-04-20 DIAGNOSIS — E2839 Other primary ovarian failure: Secondary | ICD-10-CM | POA: Diagnosis not present

## 2020-04-20 DIAGNOSIS — Z0000 Encounter for general adult medical examination without abnormal findings: Secondary | ICD-10-CM | POA: Diagnosis not present

## 2020-04-20 NOTE — Progress Notes (Signed)
Virtual Visit via Telephone Note  I connected with  Stephanie DuckLeona Stanback on 04/20/20 at  9:30 AM EST by telephone and verified that I am speaking with the correct person using two identifiers.  Medicare Annual Wellness visit completed telephonically due to Covid-19 pandemic.   Persons participating in this call: This Health Coach and this patient.   Location: Patient: Home Provider: Office   I discussed the limitations, risks, security and privacy concerns of performing an evaluation and management service by telephone and the availability of in person appointments. The patient expressed understanding and agreed to proceed.  Unable to perform video visit due to video visit attempted and failed and/or patient does not have video capability.   Some vital signs may be absent or patient reported.   Marzella Schleinina H Brylin Stanislawski, LPN    Subjective:   Stephanie Hunt is a 85 y.o. female who presents for Medicare Annual (Subsequent) preventive examination.  Review of Systems     Cardiac Risk Factors include: advanced age (>3055men, 29>65 women);dyslipidemia;hypertension     Objective:    There were no vitals filed for this visit. There is no height or weight on file to calculate BMI.  Advanced Directives 04/20/2020 03/17/2019 08/21/2017  Does Patient Have a Medical Advance Directive? Yes Yes Yes  Type of Estate agentAdvance Directive Healthcare Power of IndependenceAttorney;Living will Healthcare Power of TemelecAttorney;Living will -  Does patient want to make changes to medical advance directive? - No - Patient declined -  Copy of Healthcare Power of Attorney in Chart? No - copy requested No - copy requested -    Current Medications (verified) Outpatient Encounter Medications as of 04/20/2020  Medication Sig   AMBULATORY NON FORMULARY MEDICATION Walker with seat.  Disp 1.  Try using aerocare Sciatica M54.32   Calcium Carbonate-Vitamin D (CALTRATE 600+D PO) Take by mouth.   gabapentin (NEURONTIN) 300 MG capsule Take 1 capsule (300  mg total) by mouth 3 (three) times daily as needed (nerve pain).   Multiple Vitamin (MULTIVITAMIN) tablet Take 1 tablet by mouth daily.   valsartan-hydrochlorothiazide (DIOVAN-HCT) 160-25 MG tablet Take 1 tablet by mouth daily.   [DISCONTINUED] LORazepam (ATIVAN) 0.5 MG tablet 1-2 tabs 30 - 60 min prior to MRI. Do not drive with this medicine. (Patient not taking: Reported on 04/20/2020)   [DISCONTINUED] meloxicam (MOBIC) 15 MG tablet Take 0.5-1 tablets (7.5-15 mg total) by mouth daily as needed for pain. (Patient not taking: Reported on 04/20/2020)   No facility-administered encounter medications on file as of 04/20/2020.    Allergies (verified) Penicillins   History: Past Medical History:  Diagnosis Date   Hyperlipidemia    Hypertension    Past Surgical History:  Procedure Laterality Date   WRIST FRACTURE SURGERY Bilateral    Family History  Problem Relation Age of Onset   Cancer Mother        Uterus   Hypertension Father    Social History   Socioeconomic History   Marital status: Widowed    Spouse name: Not on file   Number of children: Not on file   Years of education: Not on file   Highest education level: Not on file  Occupational History   Occupation: retired  Tobacco Use   Smoking status: Never Smoker   Smokeless tobacco: Never Used  Building services engineerVaping Use   Vaping Use: Never used  Substance and Sexual Activity   Alcohol use: No   Drug use: No   Sexual activity: Never  Other Topics Concern   Not on  file  Social History Narrative   Moved to area from [name] in 2018   Social Determinants of Health   Financial Resource Strain: Low Risk    Difficulty of Paying Living Expenses: Not hard at all  Food Insecurity: No Food Insecurity   Worried About Programme researcher, broadcasting/film/video in the Last Year: Never true   Barista in the Last Year: Never true  Transportation Needs: No Transportation Needs   Lack of Transportation (Medical): No   Lack of  Transportation (Non-Medical): No  Physical Activity: Inactive   Days of Exercise per Week: 0 days   Minutes of Exercise per Session: 0 min  Stress: No Stress Concern Present   Feeling of Stress : Not at all  Social Connections: Moderately Isolated   Frequency of Communication with Friends and Family: More than three times a week   Frequency of Social Gatherings with Friends and Family: Once a week   Attends Religious Services: 1 to 4 times per year   Active Member of [employer] or Organizations: No   Attends Banker Meetings: Never   Marital Status: Widowed    Tobacco Counseling Counseling given: Not Answered   Clinical Intake:  Pre-visit preparation completed: Yes  Pain : No/denies pain     BMI - recorded: 23.96 Nutritional Status: BMI of 19-24  Normal Nutritional Risks: None Diabetes: No  How often do you need to have someone help you when you read instructions, pamphlets, or other written materials from your doctor or pharmacy?: 1 - Never  Diabetic?No  Interpreter Needed?: No  Information entered by :: Lanier Ensign, LPN   Activities of Daily Living In your present state of health, do you have any difficulty performing the following activities: 04/20/2020  Hearing? N  Vision? N  Difficulty concentrating or making decisions? N  Walking or climbing stairs? N  Dressing or bathing? N  Doing errands, shopping? N  Preparing Food and eating ? N  Using the Toilet? N  In the past six months, have you accidently leaked urine? N  Do you have problems with loss of bowel control? N  Managing your Medications? N  Managing your Finances? N  Housekeeping or managing your Housekeeping? N  Some recent data might be hidden    Patient Care Team: Ardith Dark, MD as PCP - General (Family Medicine) Davina Poke as Consulting Physician (Optometry) Dahlia Byes, [facility] as Pharmacist (Pharmacist)  Indicate any recent Medical Services you may have received  from other than [facility] providers in the past year (date may be approximate).     Assessment:   This is a routine wellness examination for Chau.  Hearing/Vision screen  Hearing Screening   125Hz  250Hz  500Hz  1000Hz  2000Hz  3000Hz  4000Hz  6000Hz  8000Hz   Right ear:           Left ear:           Comments: Pt denies any hearing issues   Vision Screening Comments: Follows up with Dr for annual eye exams  Dietary issues and exercise activities discussed: Current Exercise Habits: The patient does not participate in regular exercise at present, Exercise limited by: orthopedic condition(s)  Goals     Patient Stated     Take Saturday to do something "normal" that you enjoy.      Patient Stated     Get back to walking better     PharmD Care Plan     CARE PLAN ENTRY (see longitudinal plan  of care for additional care plan information)  Current Barriers:   Chronic Disease Management support, education, and care coordination needs related to Hypertension, Hyperlipidemia, and Osteopenia   Hypertension BP Readings from Last 3 Encounters:  01/13/20 130/88  12/15/19 (!) 157/84  12/01/19 130/90    Pharmacist Clinical Goal(s): o Over the next 180 days, patient will work with PharmD and providers to maintain BP goal <150/90  Current regimen:  o Valsartan-HCTZ 160-25 mg once daily  Interventions: o Home BP monitoring  Patient self care activities - Over the next 180 days, patient will: o Check BP at least once every one to weeks, document, and provide at future appointments o Ensure daily salt intake < 2300 mg/day  Hyperlipidemia Lab Results  Component Value Date/Time   LDLCALC 149 (H) 03/17/2019 09:58 AM    Pharmacist Clinical Goal(s): o Over the next 180 days, patient will work with PharmD and providers to achieve LDL goal < 130  Current regimen:  o No current medication, previously on statin  Interventions: o Diet   Patient self care activities - Over the  next 130 days, patient will: o Continue current management  Osteopenia  Pharmacist Clinical Goal(s) o Over the next 180 days, patient will work with PharmD and providers to minimize bone thinning through exercise as tolerated and appropriate vitamin D and calcium intake/supplementation   Current regimen:  o Citrical   Interventions: o Weight bearing exercises as tolerated  Patient self care activities - Over the next 180 days, patient will: o Continue current management  Medication management  Pharmacist Clinical Goal(s): o Over the next 180 days, patient will work with PharmD and providers to maintain optimal medication adherence  Current pharmacy: [facility]  Interventions o Comprehensive medication review performed. o Utilize [facility] pharmacy for medication synchronization, packaging and delivery  Patient self care activities - Over the next 180 days, patient will: o Take medications as prescribed o Report any questions or concerns to PharmD and/or provider(s)  Initial goal documentation.      Depression Screen PHQ 2/9 Scores 04/20/2020 03/17/2019 03/17/2019 08/21/2017 04/15/2017  PHQ - 2 Score 0 0 0 0 0    Fall Risk Fall Risk  04/20/2020 12/01/2019 03/17/2019 03/17/2019 08/21/2017  Falls in the past year? 0 0 0 0 No  Number falls in past yr: 0 0 - - -  Injury with Fall? 0 0 0 - -  Risk for fall due to : Impaired vision;Impaired balance/gait;Impaired mobility - - - -  Follow up Falls prevention discussed - Education provided;Falls prevention discussed;Falls evaluation completed - -    Any stairs in or around the home? Yes  If so, are there any without handrails? No  Home free of loose throw rugs in walkways, pet beds, electrical cords, etc? Yes  Adequate lighting in your home to reduce risk of falls? Yes   ASSISTIVE DEVICES UTILIZED TO PREVENT FALLS:  Life alert? No  Use of a cane, walker or w/c? Yes  Grab bars in the bathroom? No  Shower chair or bench in  shower? No  Elevated toilet seat or a handicapped toilet? No   TIMED UP AND GO:  Was the test performed? No .      Cognitive Function: MMSE - Mini Mental State Exam 08/21/2017  Not completed: (No Data)     6CIT Screen 04/20/2020 03/17/2019  What Year? 0 points 0 points  What month? 0 points 0 points  What time? - 0 points  Count back from 20  0 points 0 points  Months in reverse 0 points 0 points  Repeat phrase 0 points 0 points  Total Score - 0    Immunizations Immunization History  Administered Date(s) Administered   Fluad Quad(high Dose 65+) 03/17/2019, 03/22/2020   Influenza, High Dose Seasonal PF 03/24/2018   PFIZER SARS-COV-2 Vaccination 07/10/2019, 07/31/2019, 04/12/2020   Zoster Recombinat (Shingrix) 03/17/2019, 07/02/2019    Tdap: pt stated she had on 01/31/17, prior to relocation  Flu Vaccine status: Up to date Done 03/22/20  Pneumococcal vaccine status: Declined,  Education has been provided regarding the importance of this vaccine but patient still declined. Advised may receive this vaccine at local pharmacy or Health Dept. Aware to provide a copy of the vaccination record if obtained from local pharmacy or Health Dept. Verbalized acceptance and understanding.  Covid-19 vaccine status: Completed vaccines  Qualifies for Shingles Vaccine? Yes   Zostavax completed Yes   Shingrix Completed?: Yes  Screening Tests Health Maintenance  Topic Date Due   DEXA SCAN  04/01/2020   PNA vac Low Risk Adult (1 of 2 - PCV13) 04/20/2021 (Originally 01/19/2002)   TETANUS/TDAP  02/01/2027   INFLUENZA VACCINE  Completed   COVID-19 Vaccine  Completed    Health Maintenance  Health Maintenance Due  Topic Date Due   DEXA SCAN  04/01/2020    Colorectal cancer screening: No longer required.  Mammogram status: Completed 01/20/17. Repeat every year Bone Density status: Completed 04/01/18. Results reflect: Bone density results: OSTEOPENIA. Repeat every 2 years.    Additional Screening:    Vision Screening: Recommended annual ophthalmology exams for early detection of glaucoma and other disorders of the eye. Is the patient up to date with their annual eye exam?  Yes  Who is the provider or what is the name of the office in which the patient attends annual eye exams? Dr London Sheer   Dental Screening: Recommended annual dental exams for proper oral hygiene  Community Resource Referral / Chronic Care Management: CRR required this visit?  No   CCM required this visit?  No      Plan:     I have personally reviewed and noted the following in the patients chart:    Medical and social history  Use of alcohol, tobacco or illicit drugs   Current medications and supplements  Functional ability and status  Nutritional status  Physical activity  Advanced directives  List of other physicians  Hospitalizations, surgeries, and ER visits in previous 12 months  Vitals  Screenings to include cognitive, depression, and falls  Referrals and appointments  In addition, I have reviewed and discussed with patient certain preventive protocols, quality metrics, and best practice recommendations. A written personalized care plan for preventive services as well as general preventive health recommendations were provided to patient.     Marzella Schlein, LPN   [id number]   Nurse Notes: Pt is requesting a wheel chair related to walking in any distance is affecting her sciatic pain to bother her hips and making it diffculty to walk. She is requesting a wheelchair for assistance and making a request to send it to her insurance company's preferred DME supplier, please advise

## 2020-04-20 NOTE — Patient Instructions (Addendum)
Stephanie Hunt , Thank you for taking time to come for your Medicare Wellness Visit. I appreciate your ongoing commitment to your health goals. Please review the following plan we discussed and let me know if I can assist you in the future.   Screening recommendations/referrals: Colonoscopy: No longer required Mammogram: Done 01/20/17 Bone Density: Done 04/01/18 Recommended yearly ophthalmology/optometry visit for glaucoma screening and checkup Recommended yearly dental visit for hygiene and checkup  Vaccinations: Influenza vaccine: Done 03/22/20 Pneumococcal vaccine: Declined and discussed Tdap vaccine: Pt stated 01/31/17 Shingles vaccine: Completed 03/17/19 & 07/02/19   Covid-19:Completed 2/6, 2/27, & 04/12/20  Advanced directives: Please bring a copy of your health care power of attorney and living will to the office at your convenience.  Conditions/risks identified: Get to walking better  Next appointment: Follow up in one year for your annual wellness visit    Preventive Care 65 Years and Older, Female Preventive care refers to lifestyle choices and visits with your health care provider that can promote health and wellness. What does preventive care include?  A yearly physical exam. This is also called an annual well check.  Dental exams once or twice a year.  Routine eye exams. Ask your health care provider how often you should have your eyes checked.  Personal lifestyle choices, including:  Daily care of your teeth and gums.  Regular physical activity.  Eating a healthy diet.  Avoiding tobacco and drug use.  Limiting alcohol use.  Practicing safe sex.  Taking low-dose aspirin every day.  Taking vitamin and mineral supplements as recommended by your health care provider. What happens during an annual well check? The services and screenings done by your health care provider during your annual well check will depend on your age, overall health, lifestyle risk factors,  and family history of disease. Counseling  Your health care provider may ask you questions about your:  Alcohol use.  Tobacco use.  Drug use.  Emotional well-being.  Home and relationship well-being.  Sexual activity.  Eating habits.  History of falls.  Memory and ability to understand (cognition).  Work and work Astronomer.  Reproductive health. Screening  You may have the following tests or measurements:  Height, weight, and BMI.  Blood pressure.  Lipid and cholesterol levels. These may be checked every 5 years, or more frequently if you are over 60 years old.  Skin check.  Lung cancer screening. You may have this screening every year starting at age 37 if you have a 30-pack-year history of smoking and currently smoke or have quit within the past 15 years.  Fecal occult blood test (FOBT) of the stool. You may have this test every year starting at age 57.  Flexible sigmoidoscopy or colonoscopy. You may have a sigmoidoscopy every 5 years or a colonoscopy every 10 years starting at age 38.  Hepatitis C blood test.  Hepatitis B blood test.  Sexually transmitted disease (STD) testing.  Diabetes screening. This is done by checking your blood sugar (glucose) after you have not eaten for a while (fasting). You may have this done every 1-3 years.  Bone density scan. This is done to screen for osteoporosis. You may have this done starting at age 57.  Mammogram. This may be done every 1-2 years. Talk to your health care provider about how often you should have regular mammograms. Talk with your health care provider about your test results, treatment options, and if necessary, the need for more tests. Vaccines  Your health care provider  may recommend certain vaccines, such as:  Influenza vaccine. This is recommended every year.  Tetanus, diphtheria, and acellular pertussis (Tdap, Td) vaccine. You may need a Td booster every 10 years.  Zoster vaccine. You may need  this after age 24.  Pneumococcal 13-valent conjugate (PCV13) vaccine. One dose is recommended after age 61.  Pneumococcal polysaccharide (PPSV23) vaccine. One dose is recommended after age 67. Talk to your health care provider about which screenings and vaccines you need and how often you need them. This information is not intended to replace advice given to you by your health care provider. Make sure you discuss any questions you have with your health care provider. Document Released: 06/16/2015 Document Revised: 02/07/2016 Document Reviewed: 03/21/2015 Elsevier Interactive Patient Education  2017 [place] Prevention in the Home Falls can cause injuries. They can happen to people of all ages. There are many things you can do to make your home safe and to help prevent falls. What can I do on the outside of my home?  Regularly fix the edges of walkways and driveways and fix any cracks.  Remove anything that might make you trip as you walk through a door, such as a raised step or threshold.  Trim any bushes or trees on the path to your home.  Use bright outdoor lighting.  Clear any walking paths of anything that might make someone trip, such as rocks or tools.  Regularly check to see if handrails are loose or broken. Make sure that both sides of any steps have handrails.  Any raised decks and porches should have guardrails on the edges.  Have any leaves, snow, or ice cleared regularly.  Use sand or salt on walking paths during winter.  Clean up any spills in your garage right away. This includes oil or grease spills. What can I do in the bathroom?  Use night lights.  Install grab bars by the toilet and in the tub and shower. Do not use towel bars as grab bars.  Use non-skid mats or decals in the tub or shower.  If you need to sit down in the shower, use a plastic, non-slip stool.  Keep the floor dry. Clean up any water that spills on the floor as soon as it  happens.  Remove soap buildup in the tub or shower regularly.  Attach bath mats securely with double-sided non-slip rug tape.  Do not have throw rugs and other things on the floor that can make you trip. What can I do in the bedroom?  Use night lights.  Make sure that you have a light by your bed that is easy to reach.  Do not use any sheets or blankets that are too big for your bed. They should not hang down onto the floor.  Have a firm chair that has side arms. You can use this for support while you get dressed.  Do not have throw rugs and other things on the floor that can make you trip. What can I do in the kitchen?  Clean up any spills right away.  Avoid walking on wet floors.  Keep items that you use a lot in easy-to-reach places.  If you need to reach something above you, use a strong step stool that has a grab bar.  Keep electrical cords out of the way.  Do not use floor polish or wax that makes floors slippery. If you must use wax, use non-skid floor wax.  Do not have throw rugs  and other things on the floor that can make you trip. What can I do with my stairs?  Do not leave any items on the stairs.  Make sure that there are handrails on both sides of the stairs and use them. Fix handrails that are broken or loose. Make sure that handrails are as long as the stairways.  Check any carpeting to make sure that it is firmly attached to the stairs. Fix any carpet that is loose or worn.  Avoid having throw rugs at the top or bottom of the stairs. If you do have throw rugs, attach them to the floor with carpet tape.  Make sure that you have a light switch at the top of the stairs and the bottom of the stairs. If you do not have them, ask someone to add them for you. What else can I do to help prevent falls?  Wear shoes that:  Do not have high heels.  Have rubber bottoms.  Are comfortable and fit you well.  Are closed at the toe. Do not wear sandals.  If you  use a stepladder:  Make sure that it is fully opened. Do not climb a closed stepladder.  Make sure that both sides of the stepladder are locked into place.  Ask someone to hold it for you, if possible.  Clearly mark and make sure that you can see:  Any grab bars or handrails.  First and last steps.  Where the edge of each step is.  Use tools that help you move around (mobility aids) if they are needed. These include:  Canes.  Walkers.  Scooters.  Crutches.  Turn on the lights when you go into a dark area. Replace any light bulbs as soon as they burn out.  Set up your furniture so you have a clear path. Avoid moving your furniture around.  If any of your floors are uneven, fix them.  If there are any pets around you, be aware of where they are.  Review your medicines with your doctor. Some medicines can make you feel dizzy. This can increase your chance of falling. Ask your doctor what other things that you can do to help prevent falls. This information is not intended to replace advice given to you by your health care provider. Make sure you discuss any questions you have with your health care provider. Document Released: 03/16/2009 Document Revised: 10/26/2015 Document Reviewed: 06/24/2014 Elsevier Interactive Patient Education  2017 [employer].

## 2020-04-25 ENCOUNTER — Telehealth: Payer: Self-pay

## 2020-04-25 NOTE — Progress Notes (Signed)
    Chronic Care Management Pharmacy Assistant   Name: Stephanie Hunt  MRN: [id number] DOB: [birth year]  Reason for Encounter: Medication Review   PCP : Ardith Dark, MD  Allergies:   Allergies  Allergen Reactions  . Penicillins Rash    Medications: Outpatient Encounter Medications as of 04/25/2020  Medication Sig  . AMBULATORY NON FORMULARY MEDICATION Walker with seat.  Disp 1.  Try using aerocare Sciatica M54.32  . Calcium Carbonate-Vitamin D (CALTRATE 600+D PO) Take by mouth.  . gabapentin (NEURONTIN) 300 MG capsule Take 1 capsule (300 mg total) by mouth 3 (three) times daily as needed (nerve pain).  . Multiple Vitamin (MULTIVITAMIN) tablet Take 1 tablet by mouth daily.  . valsartan-hydrochlorothiazide (DIOVAN-HCT) 160-25 MG tablet Take 1 tablet by mouth daily.   No facility-administered encounter medications on file as of 04/25/2020.    Current Diagnosis: Patient Active Problem List   Diagnosis Date Noted  . Trochanteric bursitis of left hip 03/10/2020  . Sciatica, left side 12/15/2019  . Hypertension 04/15/2017  . Hyperlipidemia 04/15/2017  . Osteopenia 04/15/2017     Reviewed chart for medication changes ahead of medication coordination call.  No OVs, Consults, or hospital visits since last care coordination call/Pharmacist visit. (If appropriate, list visit date, provider name)  No medication changes indicated OR if recent visit, treatment plan here.  BP Readings from Last 3 Encounters:  03/10/20 110/70  02/09/20 (!) 184/83  02/04/20 140/80    No results found for: HGBA1C    Patient obtains medications through [facility]  90 Days   Patient is due for next adherence delivery on: 05-06-20. Called patient and reviewed medications. Patient stated she is up to date on medications and not needing a delivery at this time.   [name] ,CMA Clinical Pharmacist Assistant [phone]        Follow-Up:  Pharmacist Review

## 2020-05-30 ENCOUNTER — Telehealth: Payer: Self-pay

## 2020-05-30 NOTE — Progress Notes (Signed)
° ° °  Chronic Care Management Pharmacy Assistant   Name: Stephanie Hunt  MRN: [id number] DOB: [birth year]  Reason for Encounter: Medication Review  PCP : Ardith Dark, MD  Allergies:   Allergies  Allergen Reactions   Penicillins Rash    Medications: Outpatient Encounter Medications as of 05/30/2020  Medication Sig   AMBULATORY NON FORMULARY MEDICATION Walker with seat.  Disp 1.  Try using aerocare Sciatica M54.32   Calcium Carbonate-Vitamin D (CALTRATE 600+D PO) Take by mouth.   gabapentin (NEURONTIN) 300 MG capsule Take 1 capsule (300 mg total) by mouth 3 (three) times daily as needed (nerve pain).   Multiple Vitamin (MULTIVITAMIN) tablet Take 1 tablet by mouth daily.   valsartan-hydrochlorothiazide (DIOVAN-HCT) 160-25 MG tablet Take 1 tablet by mouth daily.   No facility-administered encounter medications on file as of 05/30/2020.    Current Diagnosis: Patient Active Problem List   Diagnosis Date Noted   Trochanteric bursitis of left hip 03/10/2020   Sciatica, left side 12/15/2019   Hypertension 04/15/2017   Hyperlipidemia 04/15/2017   Osteopenia 04/15/2017   Reviewed chart for medication changes ahead of medication coordination call.  No OVs, Consults, or hospital visits since last care coordination call/Pharmacist visit. (If appropriate, list visit date, provider name)  No medication changes indicated OR if recent visit, treatment plan here.  BP Readings from Last 3 Encounters:  03/10/20 110/70  02/09/20 (!) 184/83  02/04/20 140/80    No results found for: HGBA1C   Patient obtains medications through Adherence Packaging  30 Days   Last adherence delivery included:   Valsartan 160mg - Hydrochlorothiazide 25mg  Tab Take one tab every morning    90D  Patient is due for next adherence delivery on: 06-07-2020. Called patient and reviewed medications and No fill is needed at this time  ,[facility] Clinical Pharmacist  Assistant [phone]  Follow-Up:  Pharmacist Review

## 2020-06-27 ENCOUNTER — Telehealth: Payer: Self-pay

## 2020-06-27 NOTE — Progress Notes (Signed)
    Chronic Care Management Pharmacy Assistant   Name: Stephanie Hunt  MRN: [id number] DOB: 1934/11/24  Reason for Encounter: Medication Review  PCP : Ardith Dark, MD  Allergies:   Allergies  Allergen Reactions  . Penicillins Rash    Medications: Outpatient Encounter Medications as of 06/27/2020  Medication Sig  . AMBULATORY NON FORMULARY MEDICATION Walker with seat.  Disp 1.  Try using aerocare Sciatica M54.32  . Calcium Carbonate-Vitamin D (CALTRATE 600+D PO) Take by mouth.  . gabapentin (NEURONTIN) 300 MG capsule Take 1 capsule (300 mg total) by mouth 3 (three) times daily as needed (nerve pain).  . Multiple Vitamin (MULTIVITAMIN) tablet Take 1 tablet by mouth daily.  . valsartan-hydrochlorothiazide (DIOVAN-HCT) 160-25 MG tablet Take 1 tablet by mouth daily.   No facility-administered encounter medications on file as of 06/27/2020.    Current Diagnosis: Patient Active Problem List   Diagnosis Date Noted  . Trochanteric bursitis of left hip 03/10/2020  . Sciatica, left side 12/15/2019  . Hypertension 04/15/2017  . Hyperlipidemia 04/15/2017  . Osteopenia 04/15/2017   Reviewed chart for medication changes ahead of medication coordination call.  No OVs, Consults, or hospital visits since last care coordination call/Pharmacist visit. (If appropriate, list visit date, provider name)  No medication changes indicated OR if recent visit, treatment plan here.  BP Readings from Last 3 Encounters:  03/10/20 110/70  02/09/20 (!) 184/83  02/04/20 140/80    No results found for: HGBA1C   Patient obtains medications through Adherence Packaging  90 Days   Last adherence delivery included:    Valsartan-hydrochlorothiazide 160-25 mg Tab   Patient is due for next adherence delivery on: 07/07/20. Called patient and reviewed medications and coordinated delivery.  This delivery to include: Valsartan-hydrochlorothiazide 160-25 mg Tab Womans Multivitamin TAablet Calcium  Carbonate-Vitamin D   Confirmed delivery date of 07-07-20, advised patient that pharmacy will contact them the morning of delivery.   [name] ,CMA Clinical Pharmacist Assistant [phone]  Follow-Up:  Pharmacist Review

## 2020-07-04 ENCOUNTER — Telehealth: Payer: Self-pay

## 2020-07-04 NOTE — Telephone Encounter (Signed)
..   LAST APPOINTMENT DATE:07.14.21  NEXT APPOINTMENT DATE:@2 /23/2022  MEDICATION:valsartan-hydrochlorothiazide (DIOVAN-HCT) 160-25 MG tablet    PHARMACY: [address]

## 2020-07-04 NOTE — Telephone Encounter (Signed)
Pt says disregard previous message

## 2020-07-24 ENCOUNTER — Telehealth: Payer: Self-pay

## 2020-07-24 NOTE — Chronic Care Management (AMB) (Signed)
    Chronic Care Management Pharmacy Assistant   Name: Stephanie Hunt  MRN: [id number] DOB: 1935-04-28  Reason for Encounter: Medication Review  PCP : Ardith Dark, MD  Allergies:   Allergies  Allergen Reactions  . Penicillins Rash    Medications: Outpatient Encounter Medications as of 07/24/2020  Medication Sig  . AMBULATORY NON FORMULARY MEDICATION Walker with seat.  Disp 1.  Try using aerocare Sciatica M54.32  . Calcium Carbonate-Vitamin D (CALTRATE 600+D PO) Take by mouth.  . gabapentin (NEURONTIN) 300 MG capsule Take 1 capsule (300 mg total) by mouth 3 (three) times daily as needed (nerve pain).  . Multiple Vitamin (MULTIVITAMIN) tablet Take 1 tablet by mouth daily.  . valsartan-hydrochlorothiazide (DIOVAN-HCT) 160-25 MG tablet Take 1 tablet by mouth daily.   No facility-administered encounter medications on file as of 07/24/2020.    Current Diagnosis: Patient Active Problem List   Diagnosis Date Noted  . Trochanteric bursitis of left hip 03/10/2020  . Sciatica, left side 12/15/2019  . Hypertension 04/15/2017  . Hyperlipidemia 04/15/2017  . Osteopenia 04/15/2017    Reviewed chart for medication changes ahead of medication coordination call.  No OVs, Consults, or hospital visits since last care coordination call/Pharmacist visit.  No medication changes indicated.  BP Readings from Last 3 Encounters:  03/10/20 110/70  02/09/20 (!) 184/83  02/04/20 140/80    No results found for: HGBA1C   Patient obtains medications through Adherence Packaging  90 Days   Last adherence delivery included:  Valsartan-hydrochlorothiazide 160-25 mg Tab Women's Multivitamin Tablet Calcium Carbonate-Vitamin D  Patient is due for next adherence delivery on: 10/02/2020. Called patient and reviewed medications.  Patient declined the following medications: Valsartan-hydrochlorothiazide 160-25 mg Tab - 90 DS filled 07/04/2020 Women's Multivitamin Tablet - 90 DS filled  07/04/2020 Calcium Carbonate-Vitamin D - 90 DS filled 07/04/2020  Confirmed no delivery needed at this time.  Follow-Up:  Occupational hygienist and Pharmacist Review

## 2020-07-25 NOTE — Progress Notes (Unsigned)
Chronic Care Management Pharmacy Note  07/25/2020 Name:  Stephanie Hunt MRN:  [id number] DOB:  1935/05/09  Subjective: Stephanie Hunt is an 86 y.o. year old female who is a primary patient of Jerline Pain, Algis Greenhouse, MD.  The CCM team was consulted for assistance with disease management and care coordination needs.   Attempted to reach patient for telephone follow up visit in response to provider referral for pharmacy case management and/or care coordination services.   Consent to Services:  The patient was given information about Chronic Care Management services, agreed to services, and gave verbal consent prior to initiation of services.  Please see initial visit note for detailed documentation.  Patient Care Team: Vivi Barrack, MD as PCP - General (Family Medicine) Juluis Rainier as Consulting Physician (Optometry) Madelin Rear, [facility] as Pharmacist (Pharmacist)  Objective: Lab Results  Component Value Date   CREATININE 0.95 03/17/2019   BUN 14 03/17/2019   GFR 68.12 03/17/2019   NA 142 03/17/2019   K 3.7 03/17/2019   CALCIUM 9.8 03/17/2019   CO2 31 03/17/2019   Lab Results  Component Value Date/Time   GFR 68.12 03/17/2019 09:58 AM   GFR 78.25 03/24/2018 11:15 AM    Last diabetic Eye exam: No results found for: HMDIABEYEEXA  Last diabetic Foot exam: No results found for: HMDIABFOOTEX  Lab Results  Component Value Date   CHOL 244 (H) 03/17/2019   HDL 77.20 03/17/2019   LDLCALC 149 (H) 03/17/2019   TRIG 89.0 03/17/2019   CHOLHDL 3 03/17/2019   Hepatic Function Latest Ref Rng & Units 03/17/2019  Total Protein 6.0 - 8.3 g/dL 6.8  Albumin 3.5 - 5.2 g/dL 4.2  AST 0 - 37 U/L 18  ALT 0 - 35 U/L 12  Alk Phosphatase 39 - 117 U/L 87  Total Bilirubin 0.2 - 1.2 mg/dL 0.6   Lab Results  Component Value Date/Time   TSH 1.65 03/17/2019 09:58 AM   CBC Latest Ref Rng & Units 03/17/2019 03/24/2018  WBC 4.0 - 10.5 K/uL 5.9 6.9  Hemoglobin 12.0 - 15.0 g/dL 13.8 14.4  Hematocrit 36.0 - 46.0  % 41.8 42.5  Platelets 150.0 - 400.0 K/uL 248.0 219.0   Lab Results  Component Value Date/Time   VD25OH 47.49 03/17/2019 09:58 AM   DEXA 04/2018: Osteopenia, recheck 2 years.   Clinical ASCVD:  The ASCVD Risk score Mikey Bussing DC Jr., et al., 2013) failed to calculate for the following reasons:   The 2013 ASCVD risk score is only valid for ages 34 to 76    Depression screen PHQ 2/9 04/20/2020 03/17/2019 03/17/2019  Decreased Interest 0 0 0  Down, Depressed, Hopeless 0 0 0  PHQ - 2 Score 0 0 0    Social History   Tobacco Use  Smoking Status Never Smoker  Smokeless Tobacco Never Used   BP Readings from Last 3 Encounters:  03/10/20 110/70  02/09/20 (!) 184/83  02/04/20 140/80   Pulse Readings from Last 3 Encounters:  03/10/20 75  02/09/20 (!) 59  02/04/20 69   Wt Readings from Last 3 Encounters:  03/10/20 135 lb 3.2 oz (61.3 kg)  02/04/20 138 lb (62.6 kg)  01/13/20 138 lb (62.6 kg)   Assessment/Interventions: Review of patient past medical history, allergies, medications, health status, including review of consultants reports, laboratory and other test data, was performed as part of comprehensive evaluation and provision of chronic care management services.

## 2020-07-26 ENCOUNTER — Other Ambulatory Visit: Payer: Self-pay

## 2020-07-26 ENCOUNTER — Ambulatory Visit: Payer: Medicare HMO

## 2020-07-26 NOTE — Chronic Care Management (AMB) (Signed)
  Chronic Care Management   Outreach Note   Name: Stephanie Hunt MRN: [id number] DOB: Sep 04, 1934  Referred by: Ardith Dark, MD Reason for referral: Telephone Appointment with [facility] Clinical Pharmacist, Dahlia Byes.   An unsuccessful telephone outreach was attempted today. The patient was referred to the pharmacist for assistance with care management and care coordination.   Telephone appointment with clinical pharmacist today (07/26/2020) at 1pm. If patient immediately returns call, transfer to [phone]. Otherwise, please provide this number so patient can reschedule visit.   Dahlia Byes, Pharm.D., BCGP Clinical Pharmacist [facility] [phone]

## 2020-08-04 ENCOUNTER — Telehealth: Payer: Self-pay

## 2020-08-04 NOTE — Chronic Care Management (AMB) (Signed)
    Chronic Care Management Pharmacy Assistant   Name: Stephanie Hunt  MRN: [id number] DOB: 10/02/1934  Reason for Encounter: Schedule Follow-Up With Clinical Pharmacist  Patient scheduled her follow up appointment with Dahlia Byes, CPP for 08/23/2020 at 8:30 am.  April D Calhoun, [facility] Clinical Pharmacist Assistant [phone]   Patient Questions:Follow-Up:  Pharmacist Review

## 2020-08-22 NOTE — Progress Notes (Unsigned)
Chronic Care Management Pharmacy Note  08/23/2020 Name:  Stephanie Hunt MRN:  [id number] DOB:  05/01/1935  Subjective: Stephanie Hunt is an 86 y.o. year old female who is a primary patient of Jerline Pain, Algis Greenhouse, MD.  The CCM team was consulted for assistance with disease management and care coordination needs.    Engaged with patient by telephone for follow up visit in response to provider referral for pharmacy case management and/or care coordination services.   Consent to Services:  The patient was given information about Chronic Care Management services, agreed to services, and gave verbal consent prior to initiation of services.  Please see initial visit note for detailed documentation.   Patient Care Team: Vivi Barrack, MD as PCP - General (Family Medicine) Juluis Rainier as Consulting Physician (Optometry) Madelin Rear, [facility] as Pharmacist (Pharmacist)  Objective: Lab Results  Component Value Date   CREATININE 0.95 03/17/2019   CREATININE 0.89 03/24/2018   CREATININE 0.9 12/12/2016   GFR 68.12 03/17/2019   GFR 78.25 03/24/2018  Last diabetic Eye exam: No results found for: HMDIABEYEEXA  Last diabetic Foot exam: No results found for: HMDIABFOOTEX  Lab Results  Component Value Date   CHOL 244 (H) 03/17/2019   CHOL 238 (H) 03/24/2018   TRIG 89.0 03/17/2019   TRIG 118.0 03/24/2018   HDL 77.20 03/17/2019   HDL 64.40 03/24/2018   CHOLHDL 3 03/17/2019   CHOLHDL 4 03/24/2018   VLDL 17.8 03/17/2019   VLDL 23.6 03/24/2018   LDLCALC 149 (H) 03/17/2019   LDLCALC 150 (H) 03/24/2018   Hepatic Function Latest Ref Rng & Units 03/17/2019  Total Protein 6.0 - 8.3 g/dL 6.8  Albumin 3.5 - 5.2 g/dL 4.2  AST 0 - 37 U/L 18  ALT 0 - 35 U/L 12  Alk Phosphatase 39 - 117 U/L 87  Total Bilirubin 0.2 - 1.2 mg/dL 0.6   Lab Results  Component Value Date/Time   TSH 1.65 03/17/2019 09:58 AM   CBC Latest Ref Rng & Units 03/17/2019 03/24/2018  WBC 4.0 - 10.5 K/uL 5.9 6.9  Hemoglobin 12.0 - 15.0  g/dL 13.8 14.4  Hematocrit 36.0 - 46.0 % 41.8 42.5  Platelets 150.0 - 400.0 K/uL 248.0 219.0   Lab Results  Component Value Date/Time   VD25OH 47.49 03/17/2019 09:58 AM    Clinical ASCVD: No  The ASCVD Risk score ([place]., et al., 2013) failed to calculate for the following reasons:   The 2013 ASCVD risk score is only valid for ages 29 to 11    Social History   Tobacco Use  Smoking Status Never Smoker  Smokeless Tobacco Never Used   BP Readings from Last 3 Encounters:  03/10/20 110/70  02/09/20 (!) 184/83  02/04/20 140/80   Pulse Readings from Last 3 Encounters:  03/10/20 75  02/09/20 (!) 59  02/04/20 69   Wt Readings from Last 3 Encounters:  03/10/20 135 lb 3.2 oz (61.3 kg)  02/04/20 138 lb (62.6 kg)  01/13/20 138 lb (62.6 kg)    Assessment: Review of patient past medical history, allergies, medications, health status, including review of consultants reports, laboratory and other test data, was performed as part of comprehensive evaluation and provision of chronic care management services.   SDOH:  (Social Determinants of Health) assessments and interventions performed: Yes  CCM Care Plan Allergies  Allergen Reactions  . Penicillins Rash   Medications Reviewed Today    Reviewed by Madelin Rear, [facility] (Pharmacist) on 08/23/20 at [phone]  Med  List Status: <None>  Medication Order Taking? Sig Documenting Provider Last Dose Status Informant  AMBULATORY NON FORMULARY MEDICATION [id number] No Walker with seat.  Disp 1.  Try using aerocare Sciatica M54.32 Gregor Hams, MD Taking Active   Calcium Carbonate-Vitamin D (CALTRATE 600+D PO) [id number] No Take by mouth. [provider] Taking Active   gabapentin (NEURONTIN) 300 MG capsule [id number] No Take 1 capsule (300 mg total) by mouth 3 (three) times daily as needed (nerve pain). Gregor Hams, MD Taking Active   Multiple Vitamin (MULTIVITAMIN) tablet [id number] No Take 1 tablet by mouth daily. [provider] Taking Active   valsartan-hydrochlorothiazide (DIOVAN-HCT) 160-25 MG tablet [id number] No Take 1 tablet by mouth daily. Vivi Barrack, MD Taking Active          Patient Active Problem List   Diagnosis Date Noted  . Trochanteric bursitis of left hip 03/10/2020  . Sciatica, left side 12/15/2019  . Hypertension 04/15/2017  . Hyperlipidemia 04/15/2017  . Osteopenia 04/15/2017   Immunization History  Administered Date(s) Administered  . Fluad Quad(high Dose 65+) 03/17/2019, 03/22/2020  . Influenza, High Dose Seasonal PF 03/24/2018  . PFIZER(Purple Top)SARS-COV-2 Vaccination 07/10/2019, 07/31/2019, 04/12/2020  . Zoster Recombinat (Shingrix) 03/17/2019, 07/02/2019    Conditions to be addressed/monitored: HTN, HLD and Osteopenia  Care Plan : Arnold Line  Updates made by Madelin Rear, [facility] since 08/23/2020 12:00 AM    Problem: HLD HTN Osteopenia   Priority: High    Long-Range Goal: Disease Management   Start Date: 08/23/2020  Expected End Date: 08/23/2021  This Visit's Progress: On track  Priority: High  Note:    Hypertension (BP goal <140/90) -Controlled -Current treatment: . Valsartan-HCTZ 160-25 mg once daily -Current home readings: 140s/80s  -Current dietary habits: cut down on portions, avoid sweets carbohydrates -Current exercise habits: walking daily -Denies hypotensive/hypertensive symptoms -Educated on Daily salt intake goal < 2300 mg; Exercise goal of 150 minutes per week; -Counseled to monitor BP at home, document, and provide log at future appointments -Recommended to continue current medication  Osteopenia (Goal ensure DEXA f/u) -Controlled -Last DEXA Scan: 04/01/2018   T-Score femoral neck: RFN/LFN -2.2/-2.2  T-Score total hip: n/a  T-Score lumbar spine: -1.4  T-Score forearm radius: n/a  10-year probability of major osteoporotic fracture: 11.7%  10-year probability of hip fracture: 3.4% -Patient is willing to get repeat DEXA however due to  recent deaths in the family is opting to hold off for the time being and will call the office if this is something she wishes to pursue in near future. -Current treatment  . Calcium/vitamin d3 supplementation -Medications previously tried: none on file   -Recommend [phone] units of vitamin D daily. Recommend 1200 mg of calcium daily from dietary and supplemental sources. Recommend weight-bearing and muscle strengthening exercises for building and maintaining bone density. -Counseled on diet and exercise extensively Recommended to continue current medication  Patient Goals/Self-Care Activities . Over the next 365 days, patient will:  - take medications as prescribed target a minimum of 150 minutes of moderate intensity exercise weekly  Medication Assistance: Utilizing [facility] through [employer].  Patient's preferred pharmacy is:  [facility] - [place], [place] - [address] [place] [address] Phone: [phone] Fax: [phone]     Current Barriers:  . Due for repeat DEXA  Pharmacist Clinical Goal(s):  [name] Over the next 365 days, patient will contact provider office for questions/concerns as  evidenced notation of same in electronic health record through collaboration with PharmD and provider.   Interventions: . 1:1 collaboration with Vivi Barrack, MD regarding development and update of comprehensive plan of care as evidenced by provider attestation and co-signature . Inter-disciplinary care team collaboration (see longitudinal plan of care) . Comprehensive medication review performed; medication list updated in electronic medical record . Offered helped coordinating DEXA order/transportion - declined at this time . No medication changes/recommendations Follow Up:  Patient agrees to Care Plan and Follow-up. Plan: Telephone follow up appointment with care management team member scheduled for:   rph f/u call 4-6 month. monthly med synch with cpa   Future Appointments  Date Time Provider [place]  12/27/2020  3:30 PM [facility] CCM PHARMACIST [facility]  05/10/2021  8:45 AM [facility] HEALTH COACH [facility]    Madelin Rear, Pharm.D., BCGP Clinical Pharmacist [place] [phone]

## 2020-08-23 ENCOUNTER — Ambulatory Visit (INDEPENDENT_AMBULATORY_CARE_PROVIDER_SITE_OTHER): Payer: Medicare HMO

## 2020-08-23 ENCOUNTER — Telehealth: Payer: Self-pay

## 2020-08-23 DIAGNOSIS — I10 Essential (primary) hypertension: Secondary | ICD-10-CM | POA: Diagnosis not present

## 2020-08-23 DIAGNOSIS — M8580 Other specified disorders of bone density and structure, unspecified site: Secondary | ICD-10-CM

## 2020-08-23 NOTE — Patient Instructions (Signed)
Stephanie Hunt,  Thank you for taking the time to review your medications with me today.  I have included our care plan/goals in the following pages. Please review and call me at [phone] with any questions!  Thanks! Stephanie Hunt, Pharm.D., BCGP Clinical Pharmacist [place] [facility] [place] [phone] There are no care plans to display for this patient.    The patient verbalized understanding of instructions provided today and agreed to receive a MyChart copy of patient instruction and/or educational materials. Telephone follow up appointment with pharmacy team member scheduled for: See next appointment with "Care Management Staff" under "What's Next" below.

## 2020-08-23 NOTE — Chronic Care Management (AMB) (Signed)
    Chronic Care Management Pharmacy Assistant   Name: Stephanie Hunt  MRN: [id number] DOB: [birth year]  Reason for Encounter: Medication Review  Recent office visits:  n/a  Recent consult visits:  n/a  Hospital visits:  None in previous 6 months  Medications: Outpatient Encounter Medications as of 08/23/2020  Medication Sig  . AMBULATORY NON FORMULARY MEDICATION Walker with seat.  Disp 1.  Try using aerocare Sciatica M54.32  . Calcium Carbonate-Vitamin D (CALTRATE 600+D PO) Take by mouth.  . gabapentin (NEURONTIN) 300 MG capsule Take 1 capsule (300 mg total) by mouth 3 (three) times daily as needed (nerve pain).  . Multiple Vitamin (MULTIVITAMIN) tablet Take 1 tablet by mouth daily.  . valsartan-hydrochlorothiazide (DIOVAN-HCT) 160-25 MG tablet Take 1 tablet by mouth daily.   No facility-administered encounter medications on file as of 08/23/2020.    Reviewed chart for medication changes ahead of medication coordination call.  No OVs, Consults, or hospital visits since last care coordination call/Pharmacist visit.  No medication changes indicated.  BP Readings from Last 3 Encounters:  03/10/20 110/70  02/09/20 (!) 184/83  02/04/20 140/80    No results found for: HGBA1C   Patient obtains medications through Adherence Packaging  90 Days   Last adherence delivery included:  Valsartan-hydrochlorothiazide 160-25 mg Tab Women's Multivitamin Tablet Calcium Carbonate-Vitamin D  Patient is due for next adherence delivery on: 10/02/2020. Called patient and reviewed medications.   Patient declined the following medications: Valsartan-hydrochlorothiazide 160-25 mg Tab - 90 DS filled 07/04/2020 Women's Multivitamin Tablet - 90 DS filled 07/04/2020 Calcium Carbonate-Vitamin D - 90 DS filled 07/04/2020  Confirmed with patient no delivery needed at this time.  April D Calhoun, [facility] Clinical Pharmacist Assistant [phone]

## 2020-08-30 ENCOUNTER — Telehealth: Payer: Self-pay

## 2020-08-30 DIAGNOSIS — M8580 Other specified disorders of bone density and structure, unspecified site: Secondary | ICD-10-CM

## 2020-08-30 NOTE — Chronic Care Management (AMB) (Signed)
    Chronic Care Management Pharmacy Assistant   Name: Stephanie Hunt  MRN: [id number] DOB: May 09, 1935  Reason for Encounter: Patient Call   Medications: Outpatient Encounter Medications as of 08/30/2020  Medication Sig  . AMBULATORY NON FORMULARY MEDICATION Walker with seat.  Disp 1.  Try using aerocare Sciatica M54.32  . Calcium Carbonate-Vitamin D (CALTRATE 600+D PO) Take by mouth.  . gabapentin (NEURONTIN) 300 MG capsule Take 1 capsule (300 mg total) by mouth 3 (three) times daily as needed (nerve pain).  . Multiple Vitamin (MULTIVITAMIN) tablet Take 1 tablet by mouth daily.  . valsartan-hydrochlorothiazide (DIOVAN-HCT) 160-25 MG tablet Take 1 tablet by mouth daily.   No facility-administered encounter medications on file as of 08/30/2020.    Patient called stating that Dahlia Byes, CPP recommended her to have a bone density test. Patient states she doesn't know where or when or how to schedule a bone density test.   Patient also states that Dahlia Byes, CPP asked her if she has any constipation with taking Calcium. Patient states she doesn't have constipation. She states she does get a tummy ache with it and thinks she may be experiencing gas pains. She states due to the gas pains she only takes one tablet a day instead of 2.  April D Calhoun, [facility] Clinical Pharmacist Assistant [phone]

## 2020-09-05 NOTE — Telephone Encounter (Signed)
DEXA order has been renewed, called and spoke with patient gave her GI's phone number to schedule scan.

## 2020-09-05 NOTE — Addendum Note (Signed)
Addended by: Katharine Look on: 09/05/2020 10:57 AM   Modules accepted: Orders

## 2020-09-26 ENCOUNTER — Telehealth: Payer: Self-pay

## 2020-09-26 ENCOUNTER — Other Ambulatory Visit: Payer: Self-pay | Admitting: *Deleted

## 2020-09-26 MED ORDER — VALSARTAN-HYDROCHLOROTHIAZIDE 160-25 MG PO TABS: 1.0000 | ORAL_TABLET | Freq: Every day | ORAL | 3 refills | Status: DC

## 2020-09-26 NOTE — Telephone Encounter (Signed)
Rx send to pharmacy  

## 2020-09-26 NOTE — Chronic Care Management (AMB) (Signed)
    Chronic Care Management Pharmacy Assistant   Name: Carrol Hougland  MRN: [id number] DOB: 08-25-1934  Reason for Encounter: Medication Coordination Call   Recent office visits:  No visits noted  Recent consult visits:  No visits noted  Hospital visits:  None in previous 6 months  Medications: Outpatient Encounter Medications as of 09/26/2020  Medication Sig  . AMBULATORY NON FORMULARY MEDICATION Walker with seat.  Disp 1.  Try using aerocare Sciatica M54.32  . Calcium Carbonate-Vitamin D (CALTRATE 600+D PO) Take by mouth.  . gabapentin (NEURONTIN) 300 MG capsule Take 1 capsule (300 mg total) by mouth 3 (three) times daily as needed (nerve pain).  . Multiple Vitamin (MULTIVITAMIN) tablet Take 1 tablet by mouth daily.  . valsartan-hydrochlorothiazide (DIOVAN-HCT) 160-25 MG tablet Take 1 tablet by mouth daily.   No facility-administered encounter medications on file as of 09/26/2020.   Reviewed chart for medication changes ahead of medication coordination call.  No OVs, Consults, or hospital visits since last care coordination call/Pharmacist visit. (If appropriate, list visit date, provider name)  No medication changes indicated OR if recent visit, treatment plan here.  BP Readings from Last 3 Encounters:  03/10/20 110/70  02/09/20 (!) 184/83  02/04/20 140/80    No results found for: HGBA1C   Patient obtains medications through Adherence Packaging  90 Days   Last adherence delivery included:  Valsartan-hydrochlorothiazide 160-25 mg Tab Women's Multivitamin Tablet Calcium Carbonate-Vitamin D   Patient is due for next adherence delivery on: 10/05/2020. Called patient and reviewed medications and coordinated delivery.  This delivery to include: Valsartan-hydrochlorothiazide 160-25 mg- one tab daily   Patient declined the following medications due to an abundance of on hand quantity Women's Multivitamin Tablet Calcium Carbonate-Vitamin D  Patient needs refills for  Valsartan-hydrochlorothiazide 160-25 mg Tab  Confirmed delivery date of 10/05/20, advised patient that pharmacy will contact them the morning of delivery.  Purvis Kilts CPA, CMA

## 2020-10-02 ENCOUNTER — Telehealth: Payer: Self-pay

## 2020-10-02 NOTE — Telephone Encounter (Signed)
.   90 day supply LAST APPOINTMENT DATE: 09/26/2020   NEXT APPOINTMENT DATE:@7 /27/2022  MEDICATION:valsartan-hydrochlorothiazide (DIOVAN-HCT) 160-25 MG tablet   PHARMACY:[facility] - [place], [place] - [address]

## 2020-10-02 NOTE — Telephone Encounter (Signed)
Rx was send to [facility] on 09/26/2020

## 2020-10-03 NOTE — Telephone Encounter (Signed)
Pt called stating the pharmacy told her the prescription would not be delivered until the end of this week. Pt states she leaves for her granddaughters wedding tomorrow. Pt asked if Dr. Jimmey Ralph could send this prescription to [facility] on [address] and [facility].

## 2020-10-03 NOTE — Telephone Encounter (Signed)
Spoke with pharmacy [facility], stated Rx is out to delivery tomorrow  Call patient to give information, was talking to patient call was dropped  Called patient back x 2 went to voice mail

## 2020-10-05 ENCOUNTER — Other Ambulatory Visit: Payer: Self-pay | Admitting: *Deleted

## 2020-10-05 ENCOUNTER — Telehealth: Payer: Self-pay

## 2020-10-05 MED ORDER — VALSARTAN-HYDROCHLOROTHIAZIDE 160-25 MG PO TABS: 1.0000 | ORAL_TABLET | Freq: Every day | ORAL | 0 refills | Status: DC

## 2020-10-05 NOTE — Telephone Encounter (Signed)
Send Rx to [facility]

## 2020-10-05 NOTE — Telephone Encounter (Signed)
.   Patient states that [facility] never delivered medication and patient is going out of town today and needs medication before she leaves can we send this asap today   LAST APPOINTMENT DATE: 10/02/2020   NEXT APPOINTMENT DATE:@7 /27/2022  MEDICATION:valsartan-hydrochlorothiazide (DIOVAN-HCT) 160-25 MG tablet   PHARMACY:[facility] [address] [place] - [address]

## 2020-10-18 ENCOUNTER — Inpatient Hospital Stay: Admission: RE | Admit: 2020-10-18 | Payer: Medicare HMO | Source: Ambulatory Visit

## 2020-10-27 ENCOUNTER — Other Ambulatory Visit: Payer: Self-pay | Admitting: Family Medicine

## 2020-10-27 ENCOUNTER — Telehealth: Payer: Self-pay

## 2020-10-27 MED ORDER — VALSARTAN-HYDROCHLOROTHIAZIDE 160-25 MG PO TABS: 1.0000 | ORAL_TABLET | Freq: Every day | ORAL | 0 refills | Status: DC

## 2020-10-27 NOTE — Telephone Encounter (Signed)
Pt notified Rx was sent to pharmacy for her.

## 2020-10-27 NOTE — Telephone Encounter (Signed)
  LAST APPOINTMENT DATE: 10/05/2020   NEXT APPOINTMENT DATE:@6 /15/2022  MEDICATION:valsartan-hydrochlorothiazide (DIOVAN-HCT) 160-25 MG tablet  PHARMACY:[facility] [address], [place] - [address]   Please advise

## 2020-11-15 ENCOUNTER — Ambulatory Visit (INDEPENDENT_AMBULATORY_CARE_PROVIDER_SITE_OTHER): Payer: Medicare HMO | Admitting: Family Medicine

## 2020-11-15 ENCOUNTER — Other Ambulatory Visit: Payer: Self-pay

## 2020-11-15 VITALS — BP 157/86 | HR 58 | Temp 98.0°F | Ht 63.0 in | Wt 137.2 lb

## 2020-11-15 DIAGNOSIS — M8580 Other specified disorders of bone density and structure, unspecified site: Secondary | ICD-10-CM | POA: Diagnosis not present

## 2020-11-15 DIAGNOSIS — Z23 Encounter for immunization: Secondary | ICD-10-CM

## 2020-11-15 DIAGNOSIS — J309 Allergic rhinitis, unspecified: Secondary | ICD-10-CM

## 2020-11-15 DIAGNOSIS — I10 Essential (primary) hypertension: Secondary | ICD-10-CM | POA: Diagnosis not present

## 2020-11-15 DIAGNOSIS — R739 Hyperglycemia, unspecified: Secondary | ICD-10-CM

## 2020-11-15 DIAGNOSIS — E785 Hyperlipidemia, unspecified: Secondary | ICD-10-CM

## 2020-11-15 LAB — LIPID PANEL
Cholesterol: 241 mg/dL — ABNORMAL HIGH (ref 0–200)
HDL: 70 mg/dL (ref >39.00)
LDL Cholesterol: 152 mg/dL — ABNORMAL HIGH (ref 0–99)
NonHDL: 170.97
Total CHOL/HDL Ratio: 3
Triglycerides: 93 mg/dL (ref 0.0–149.0)
VLDL: 18.6 mg/dL (ref 0.0–40.0)

## 2020-11-15 LAB — TSH: TSH: 1.41 u[IU]/mL (ref 0.35–4.50)

## 2020-11-15 LAB — COMPREHENSIVE METABOLIC PANEL
ALT: 14 U/L (ref 0–35)
AST: 19 U/L (ref 0–37)
Albumin: 4.2 g/dL (ref 3.5–5.2)
Alkaline Phosphatase: 94 U/L (ref 39–117)
BUN: 13 mg/dL (ref 6–23)
CO2: 31 mEq/L (ref 19–32)
Calcium: 9.7 mg/dL (ref 8.4–10.5)
Chloride: 101 mEq/L (ref 96–112)
Creatinine, Ser: 1.02 mg/dL (ref 0.40–1.20)
GFR: 50.76 mL/min — ABNORMAL LOW (ref >60.00)
Glucose, Bld: 88 mg/dL (ref 70–99)
Potassium: 4.2 mEq/L (ref 3.5–5.1)
Sodium: 140 mEq/L (ref 135–145)
Total Bilirubin: 0.5 mg/dL (ref 0.2–1.2)
Total Protein: 7.4 g/dL (ref 6.0–8.3)

## 2020-11-15 LAB — CBC
HCT: 42.1 % (ref 36.0–46.0)
Hemoglobin: 14.3 g/dL (ref 12.0–15.0)
MCHC: 34 g/dL (ref 30.0–36.0)
MCV: 87.7 fl (ref 78.0–100.0)
Platelets: 281 10*3/uL (ref 150.0–400.0)
RBC: 4.8 Mil/uL (ref 3.87–5.11)
RDW: 14.8 % (ref 11.5–15.5)
WBC: 6.7 10*3/uL (ref 4.0–10.5)

## 2020-11-15 LAB — HEMOGLOBIN A1C: Hgb A1c MFr Bld: 5.9 % (ref 4.6–6.5)

## 2020-11-15 MED ORDER — VALSARTAN-HYDROCHLOROTHIAZIDE 160-25 MG PO TABS: 1.0000 | ORAL_TABLET | Freq: Every day | ORAL | 3 refills | Status: DC

## 2020-11-15 NOTE — Assessment & Plan Note (Signed)
Will be getting bone density scan later this year.

## 2020-11-15 NOTE — Assessment & Plan Note (Signed)
Check lipids today 

## 2020-11-15 NOTE — Assessment & Plan Note (Signed)
Stable on OTC meds. Recommended Pataday drops as needed.

## 2020-11-15 NOTE — Progress Notes (Signed)
    Stephanie Hunt is a 86 y.o. female who presents today for an office visit.  Assessment/Plan:  New Issues: Flu/syncope Sounds like vasovagal syncope based on history.  Thankfully she has recovered from fluid.  Reassuring exam today.  Reassuring that she has not had any recurrence of symptoms and no other neurologic symptoms over the past month.  Given resolution of symptoms and the fact that this occurred over a month ago we will not pursue further work-up at this time.  She will let us know if she has any further episodes or any other change in neurologic symptoms.  Discussed reasons return to care.  Chronic Problems Addressed Today: Allergic rhinitis Stable on OTC meds. Recommended Pataday drops as needed.  Osteopenia   Will be getting bone density scan later this year.  Hyperlipidemia Check lipids today.  Hypertension Slightly above goal today.  She has typically been well controlled per JNC 8 guidelines.  Will refill 1 year supply of valsartan-HCTZ 1 60-25.  She will continue home monitoring goal 150/90 or lower.  Check labs today.     Subjective:  HPI:  Her family is well. She is doing reasonably well today. Her gait has improved, she is no longer using a walker after recovering from the Flu in May. She recalls having one syncopal episode and some bouts of vomiting, however she continues to recover. Post-Flu symptoms- she continues to have some fatigue and loss of appetite.  Denies having any weakness or numbness in her lower extremity bilaterally. Her blood pressure is slightly elevated today-she checks it occasionally at home. She struggles with seasonal allergies but this is controlled by her OTC allergy medications. Next Bone Density scheduled for 03/23/21.        Objective:  Physical Exam: BP (!) 157/86   Pulse (!) 58   Temp 98 F (36.7 C)   Ht 5\' 3"  (1.6 m)   Wt 137 lb 3.2 oz (62.2 kg)   SpO2 98%   BMI 24.30 kg/m   Gen: No acute distress, resting comfortably CV:  Regular rate and rhythm with no murmurs appreciated Pulm: Normal work of breathing, clear to auscultation bilaterally with no crackles, wheezes, or rhonchi Neuro: Grossly normal, moves all extremities Psych: Normal affect and thought content      I,Alexis Bryant,acting as a scribe for , MD.,have documented all relevant documentation on the behalf of Jacquiline Doe, MD,as directed by  Jacquiline Doe, MD while in the presence of Jacquiline Doe, MD.  I, Jacquiline Doe, MD, have reviewed all documentation for this visit. The documentation on 11/15/20 for the exam, diagnosis, procedures, and orders are all accurate and complete.   11/17/20. [name], MD 11/15/2020 8:47 AM

## 2020-11-15 NOTE — Patient Instructions (Signed)
It was very nice to see you today!  We will check blood work today.  We will give you your pneumonia vaccine today.  Please keep an eye on your blood pressure and let us know if it is persistently 150/90 or higher.  Please let us know if you have any recurrence of your sciatica.  I will see you back in 1 year for your annual physical.  Please come back to see me sooner if needed.  Take care, Dr Jimmey Ralph  PLEASE NOTE:  If you had any lab tests please let us know if you have not heard back within a few days. You may see your results on mychart before we have a chance to review them but we will give you a call once they are reviewed by [place]. If we ordered any referrals today, please let us know if you have not heard from their office within the next week.   Please try these tips to maintain a healthy lifestyle:  Eat at least 3 REAL meals and 1-2 snacks per day.  Aim for no more than 5 hours between eating.  If you eat breakfast, please do so within one hour of getting up.   Each meal should contain half fruits/vegetables, one quarter protein, and one quarter carbs (no bigger than a computer mouse)  Cut down on sweet beverages. This includes juice, soda, and sweet tea.   Drink at least 1 glass of water with each meal and aim for at least 8 glasses per day  Exercise at least 150 minutes every week.

## 2020-11-15 NOTE — Assessment & Plan Note (Signed)
Slightly above goal today.  She has typically been well controlled per JNC 8 guidelines.  Will refill 1 year supply of valsartan-HCTZ 1 60-25.  She will continue home monitoring goal 150/90 or lower.  Check labs today.

## 2020-11-16 NOTE — Progress Notes (Signed)
Please inform patient of the following:  Cholesterol blood sugar both borderline but stable.  Everything else is normal.  Do not need to make any med changes.  Would like for her to continue working on diet and exercise we can recheck in a year.

## 2020-12-25 ENCOUNTER — Telehealth: Payer: Self-pay | Admitting: Pharmacist

## 2020-12-25 NOTE — Chronic Care Management (AMB) (Addendum)
    Chronic Care Management Pharmacy Assistant   Name: Kynslee Baham  MRN: [id number] DOB: [birth year]   Reason for Encounter: General Adherence Disease State Call   Recent office visits:  None  Recent consult visits:  None  Hospital visits:  None in previous 6 months  Medications: Outpatient Encounter Medications as of 12/25/2020  Medication Sig   AMBULATORY NON FORMULARY MEDICATION Walker with seat.  Disp 1.  Try using aerocare Sciatica M54.32   Calcium Carbonate-Vitamin D (CALTRATE 600+D PO) Take by mouth.   Multiple Vitamin (MULTIVITAMIN) tablet Take 1 tablet by mouth daily.   valsartan-hydrochlorothiazide (DIOVAN-HCT) 160-25 MG tablet Take 1 tablet by mouth daily.   No facility-administered encounter medications on file as of 12/25/2020.   Patient Questions: Have you had any problems recently with your health? Patient states she has not had any problems recently with her health.  Have you had any problems with your pharmacy? Patient states she has not had any problems recently with her pharmacy.  What issues or side effects are you having with your medications? Patient states she has not had any issues or side effects from any of her medications that she is aware of.  What would you like me to pass along to Erskine Emery, CPP for him to help you with?  Patient states she does not have anything to pass along at this time.  What can we do to take care of you better? Patient did not have any suggestions. She states she is doing well at this time.  Future Appointments  Date Time Provider Department Center  12/27/2020  3:30 PM [facility] CCM PHARMACIST [facility]  03/23/2021  7:30 AM [facility] DX DEXA 1 [facility]  05/10/2021  8:45 AM [facility] HEALTH COACH [facility]     Star Rating Drugs: Valsartan-HCTZ 160-25 mg last filled 10/28/2020 30 DS  April D Calhoun, [facility] Clinical Pharmacist Assistant [phone]   10 minutes spent in review, coordination, and  documentation.  Reviewed by: Willa Frater, PharmD Clinical Pharmacist [phone]

## 2020-12-27 ENCOUNTER — Telehealth: Payer: Medicare HMO

## 2021-01-18 DIAGNOSIS — Z0100 Encounter for examination of eyes and vision without abnormal findings: Secondary | ICD-10-CM | POA: Diagnosis not present

## 2021-01-18 DIAGNOSIS — H40013 Open angle with borderline findings, low risk, bilateral: Secondary | ICD-10-CM | POA: Diagnosis not present

## 2021-01-30 ENCOUNTER — Ambulatory Visit (INDEPENDENT_AMBULATORY_CARE_PROVIDER_SITE_OTHER): Payer: Medicare HMO | Admitting: Pharmacist

## 2021-01-30 ENCOUNTER — Telehealth: Payer: Medicare HMO

## 2021-01-30 DIAGNOSIS — I10 Essential (primary) hypertension: Secondary | ICD-10-CM

## 2021-01-30 DIAGNOSIS — M8589 Other specified disorders of bone density and structure, multiple sites: Secondary | ICD-10-CM | POA: Diagnosis not present

## 2021-01-30 DIAGNOSIS — M8580 Other specified disorders of bone density and structure, unspecified site: Secondary | ICD-10-CM

## 2021-01-30 NOTE — Progress Notes (Signed)
Chronic Care Management Pharmacy Note  01/30/2021 Name:  Stephanie Hunt MRN:  [id number] DOB:  December 31, 1934  Subjective: Stephanie Hunt is an 86 y.o. year old female who is a primary patient of Jerline Pain, Algis Greenhouse, MD.  The CCM team was consulted for assistance with disease management and care coordination needs.    Engaged with patient by telephone for follow up visit in response to provider referral for pharmacy case management and/or care coordination services.   Consent to Services:  The patient was given information about Chronic Care Management services, agreed to services, and gave verbal consent prior to initiation of services.  Please see initial visit note for detailed documentation.   Patient Care Team: Vivi Barrack, MD as PCP - General (Family Medicine) Juluis Rainier as Consulting Physician (Optometry) Madelin Rear, [facility] as Pharmacist (Pharmacist)  Objective: Lab Results  Component Value Date   CREATININE 1.02 11/15/2020   CREATININE 0.95 03/17/2019   CREATININE 0.89 03/24/2018   GFR 50.76 (L) 11/15/2020   GFR 68.12 03/17/2019  Last diabetic Eye exam: No results found for: HMDIABEYEEXA  Last diabetic Foot exam: No results found for: HMDIABFOOTEX  Lab Results  Component Value Date   CHOL 241 (H) 11/15/2020   CHOL 244 (H) 03/17/2019   TRIG 93.0 11/15/2020   TRIG 89.0 03/17/2019   HDL 70.00 11/15/2020   HDL 77.20 03/17/2019   CHOLHDL 3 11/15/2020   CHOLHDL 3 03/17/2019   VLDL 18.6 11/15/2020   VLDL 17.8 03/17/2019   LDLCALC 152 (H) 11/15/2020   LDLCALC 149 (H) 03/17/2019   Hepatic Function Latest Ref Rng & Units 11/15/2020 03/17/2019  Total Protein 6.0 - 8.3 g/dL 7.4 6.8  Albumin 3.5 - 5.2 g/dL 4.2 4.2  AST 0 - 37 U/L 19 18  ALT 0 - 35 U/L 14 12  Alk Phosphatase 39 - 117 U/L 94 87  Total Bilirubin 0.2 - 1.2 mg/dL 0.5 0.6   Lab Results  Component Value Date/Time   TSH 1.41 11/15/2020 08:51 AM   TSH 1.65 03/17/2019 09:58 AM   CBC Latest Ref Rng & Units 11/15/2020  03/17/2019 03/24/2018  WBC 4.0 - 10.5 K/uL 6.7 5.9 6.9  Hemoglobin 12.0 - 15.0 g/dL 14.3 13.8 14.4  Hematocrit 36.0 - 46.0 % 42.1 41.8 42.5  Platelets 150.0 - 400.0 K/uL 281.0 248.0 219.0   Lab Results  Component Value Date/Time   VD25OH 47.49 03/17/2019 09:58 AM    Clinical ASCVD: No  The ASCVD Risk score ([place]., et al., 2013) failed to calculate for the following reasons:   The 2013 ASCVD risk score is only valid for ages 61 to 69    Social History   Tobacco Use  Smoking Status Never  Smokeless Tobacco Never   BP Readings from Last 3 Encounters:  11/15/20 (!) 157/86  03/10/20 110/70  02/09/20 (!) 184/83   Pulse Readings from Last 3 Encounters:  11/15/20 (!) 58  03/10/20 75  02/09/20 (!) 59   Wt Readings from Last 3 Encounters:  11/15/20 137 lb 3.2 oz (62.2 kg)  03/10/20 135 lb 3.2 oz (61.3 kg)  02/04/20 138 lb (62.6 kg)    Assessment: Review of patient past medical history, allergies, medications, health status, including review of consultants reports, laboratory and other test data, was performed as part of comprehensive evaluation and provision of chronic care management services.   SDOH:  (Social Determinants of Health) assessments and interventions performed: Yes  CCM Care Plan Allergies  Allergen Reactions  Penicillins Rash   Medications Reviewed Today     Reviewed by Edythe Clarity, [facility] (Pharmacist) on 01/30/21 at [phone]  Med List Status: <None>   Medication Order Taking? Sig Documenting Provider Last Dose Status Informant  AMBULATORY NON FORMULARY MEDICATION [id number] Yes Walker with seat.  Disp 1.  Try using aerocare Sciatica M54.32 Gregor Hams, MD Taking Active   Calcium Carbonate-Vitamin D (CALTRATE 600+D PO) [id number] Yes Take by mouth. [provider] Taking Active   Multiple Vitamin (MULTIVITAMIN) tablet [id number] Yes Take 1 tablet by mouth daily. [provider] Taking Active   valsartan-hydrochlorothiazide  (DIOVAN-HCT) 160-25 MG tablet [id number] Yes Take 1 tablet by mouth daily. Vivi Barrack, MD Taking Active            Patient Active Problem List   Diagnosis Date Noted   Allergic rhinitis 11/15/2020   Trochanteric bursitis of left hip 03/10/2020   Sciatica, left side 12/15/2019   Hypertension 04/15/2017   Hyperlipidemia 04/15/2017   Osteopenia 04/15/2017   Immunization History  Administered Date(s) Administered   Fluad Quad(high Dose 65+) 03/17/2019, 03/22/2020   Influenza, High Dose Seasonal PF 03/24/2018   PFIZER(Purple Top)SARS-COV-2 Vaccination 07/10/2019, 07/31/2019, 04/12/2020   PNEUMOCOCCAL CONJUGATE-20 11/15/2020   Zoster Recombinat (Shingrix) 03/17/2019, 07/02/2019    Conditions to be addressed/monitored: HTN, HLD and Osteopenia  Care Plan : [place]  Updates made by Edythe Clarity, RPH since 01/30/2021 12:00 AM     Problem: HLD HTN Osteopenia   Priority: High     Long-Range Goal: Disease Management   Start Date: 08/23/2020  Expected End Date: 08/23/2021  Recent Progress: On track  Priority: High  Note:    Current Barriers:  Due for repeat DEXA - scheduled for October 2022    Hypertension (BP goal <140/90) -Controlled -Current treatment: Valsartan-HCTZ 160-25 mg once daily -Current home readings: 140s/80s  -Current dietary habits: cut down on portions, avoid sweets carbohydrates -Current exercise habits: walking daily -Denies hypotensive/hypertensive symptoms -Educated on Daily salt intake goal < 2300 mg; Exercise goal of 150 minutes per week; -Counseled to monitor BP at home, document, and provide log at future appointments -Recommended to continue current medication  Update 01/30/21 BP this morning was 130/77!! Denies any dizziness or HA's - states BP remains around this all of the time.  No changes to medications needed at this time.  Osteopenia (Goal ensure DEXA f/u) -Controlled -Last DEXA Scan: 04/01/2018   T-Score  femoral neck: RFN/LFN -2.2/-2.2  T-Score total hip: n/a  T-Score lumbar spine: -1.4  T-Score forearm radius: n/a  10-year probability of major osteoporotic fracture: 11.7%  10-year probability of hip fracture: 3.4% -Patient is willing to get repeat DEXA however due to recent deaths in the family is opting to hold off for the time being and will call the office if this is something she wishes to pursue in near future. -Current treatment  Calcium/vitamin d3 supplementation -Medications previously tried: none on file   -Recommend [phone] units of vitamin D daily. Recommend 1200 mg of calcium daily from dietary and supplemental sources. Recommend weight-bearing and muscle strengthening exercises for building and maintaining bone density. -Counseled on diet and exercise extensively Recommended to continue current medication  Update Reminded patient of her upcoming bone density scan on 03/23/21. Will evaluate next steps after that date  Patient Goals/Self-Care Activities Over the next 365 days, patient will:  - take medications as prescribed target a minimum of 150 minutes of moderate intensity exercise weekly  Medication Assistance: None needed Patient's preferred pharmacy is:  [facility] [address] [place] - [address] [place] [place] [address] Phone: [phone] Fax: [phone]         Current Barriers:  Due for repeat DEXA  Pharmacist Clinical Goal(s):  Over the next 365 days, patient will contact provider office for questions/concerns as evidenced notation of same in electronic health record through collaboration with PharmD and provider.   Interventions: 1:1 collaboration with Vivi Barrack, MD regarding development and update of comprehensive plan of care as evidenced by provider attestation and co-signature Inter-disciplinary care team collaboration (see longitudinal plan of care) Comprehensive medication review performed;  medication list updated in electronic medical record Follow Up:  Patient agrees to Care Plan and Follow-up. Plan: 6 month FU Future Appointments  Date Time Provider [place]  03/23/2021  7:30 AM [facility] DX DEXA 1 [facility]  05/10/2021  8:45 AM [facility] HEALTH COACH [facility] [place], PharmD Clinical Pharmacist [phone]

## 2021-01-30 NOTE — Patient Instructions (Addendum)
Visit Information   Goals Addressed   None    Patient Care Plan: CCM Pharmacy Care Plan     Problem Identified: HLD HTN Osteopenia   Priority: High     Long-Range Goal: Disease Management   Start Date: 08/23/2020  Expected End Date: 08/23/2021  Recent Progress: On track  Priority: High  Note:    Current Barriers:  Due for repeat DEXA - scheduled for October 2022    Hypertension (BP goal <140/90) -Controlled -Current treatment: Valsartan-HCTZ 160-25 mg once daily -Current home readings: 140s/80s  -Current dietary habits: cut down on portions, avoid sweets carbohydrates -Current exercise habits: walking daily -Denies hypotensive/hypertensive symptoms -Educated on Daily salt intake goal < 2300 mg; Exercise goal of 150 minutes per week; -Counseled to monitor BP at home, document, and provide log at future appointments -Recommended to continue current medication  Update 01/30/21 BP this morning was 130/77!! Denies any dizziness or HA's - states BP remains around this all of the time.  No changes to medications needed at this time.  Osteopenia (Goal ensure DEXA f/u) -Controlled -Last DEXA Scan: 04/01/2018   T-Score femoral neck: RFN/LFN -2.2/-2.2  T-Score total hip: n/a  T-Score lumbar spine: -1.4  T-Score forearm radius: n/a  10-year probability of major osteoporotic fracture: 11.7%  10-year probability of hip fracture: 3.4% -Patient is willing to get repeat DEXA however due to recent deaths in the family is opting to hold off for the time being and will call the office if this is something she wishes to pursue in near future. -Current treatment  Calcium/vitamin d3 supplementation -Medications previously tried: none on file   -Recommend [phone] units of vitamin D daily. Recommend 1200 mg of calcium daily from dietary and supplemental sources. Recommend weight-bearing and muscle strengthening exercises for building and maintaining bone density. -Counseled on diet  and exercise extensively Recommended to continue current medication  Update Reminded patient of her upcoming bone density scan on 03/23/21. Will evaluate next steps after that date  Patient Goals/Self-Care Activities Over the next 365 days, patient will:  - take medications as prescribed target a minimum of 150 minutes of moderate intensity exercise weekly  Medication Assistance: None needed Patient's preferred pharmacy is:  [facility] [address], [place] - [address] [place] [address] Phone: [phone] Fax: [phone]          Patient verbalizes understanding of instructions provided today and agrees to view in MyChart.  Telephone follow up appointment with pharmacy team member scheduled for: 6 months  Erroll Luna, [place]  [phone]

## 2021-02-01 DIAGNOSIS — H40013 Open angle with borderline findings, low risk, bilateral: Secondary | ICD-10-CM | POA: Diagnosis not present

## 2021-03-20 ENCOUNTER — Telehealth: Payer: Self-pay | Admitting: Pharmacist

## 2021-03-20 NOTE — Chronic Care Management (AMB) (Signed)
    Chronic Care Management Pharmacy Assistant   Name: Simranjit Thayer  MRN: [id number] DOB: 1934/07/21   Reason for Encounter: Hypertension Adherence Call    Recent office visits:  None  Recent consult visits:  None  Hospital visits:  None in previous 6 months  Medications: Outpatient Encounter Medications as of 03/20/2021  Medication Sig   AMBULATORY NON FORMULARY MEDICATION Walker with seat.  Disp 1.  Try using aerocare Sciatica M54.32   Calcium Carbonate-Vitamin D (CALTRATE 600+D PO) Take by mouth.   Multiple Vitamin (MULTIVITAMIN) tablet Take 1 tablet by mouth daily.   valsartan-hydrochlorothiazide (DIOVAN-HCT) 160-25 MG tablet Take 1 tablet by mouth daily.   No facility-administered encounter medications on file as of 03/20/2021.   Reviewed chart prior to disease state call. Spoke with patient regarding BP  Recent Office Vitals: BP Readings from Last 3 Encounters:  11/15/20 (!) 157/86  03/10/20 110/70  02/09/20 (!) 184/83   Pulse Readings from Last 3 Encounters:  11/15/20 (!) 58  03/10/20 75  02/09/20 (!) 59    Wt Readings from Last 3 Encounters:  11/15/20 137 lb 3.2 oz (62.2 kg)  03/10/20 135 lb 3.2 oz (61.3 kg)  02/04/20 138 lb (62.6 kg)     Kidney Function Lab Results  Component Value Date/Time   CREATININE 1.02 11/15/2020 08:51 AM   CREATININE 0.95 03/17/2019 09:58 AM   GFR 50.76 (L) 11/15/2020 08:51 AM    BMP Latest Ref Rng & Units 11/15/2020 03/17/2019 03/24/2018  Glucose 70 - 99 mg/dL 88 83 88  BUN 6 - 23 mg/dL 13 14 16   Creatinine 0.40 - 1.20 mg/dL 9.38 1.82  Sodium 135 - 145 mEq/L 140 142 141  Potassium 3.5 - 5.1 mEq/L 4.2 3.7 4.3  Chloride 96 - 112 mEq/L 101 100 103  CO2 19 - 32 mEq/L 31 31 32  Calcium 8.4 - 10.5 mg/dL 9.7 9.8 9.93    Current antihypertensive regimen:  Valsartan-HCTZ 160-25 mg once daily  How often are you checking your Blood Pressure? Daily  Current home BP readings: 128/77, 133/78  What recent  interventions/DTPs have been made by any provider to improve Blood Pressure control since last CPP Visit: No recent interventions or DTPs.  Any recent hospitalizations or ED visits since last visit with CPP? No  What diet changes have been made to improve Blood Pressure Control?  Patient states she eats healthy meals.  What exercise is being done to improve your Blood Pressure Control?  Patient states she rides a stationary bike every morning and walks a few days a week when the weather is nice outside.  Adherence Review: Is the patient currently on ACE/ARB medication? Yes Does the patient have >5 day gap between last estimated fill dates? No   Care Gaps: Medicare Annual Wellness: scheduled 05/10/2021 Hemoglobin A1C: 5.9% on 11/15/2020 Colonoscopy: Aged out Dexa Scan: scheduled 07/18/2021 Mammogram: completed 01/20/2017  Future Appointments  Date Time Provider Department Center  05/10/2021  8:45 AM [facility] HEALTH COACH [facility]  07/18/2021  7:30 AM [facility] DX DEXA 1 [facility]  08/06/2021  3:00 PM [facility] CCM PHARMACIST [facility]    Star Rating Drugs: Valsartan-HCTZ last filled 02/17/2021 90 DS  April D Calhoun, [facility] Clinical Pharmacist Assistant [phone]

## 2021-03-23 ENCOUNTER — Other Ambulatory Visit: Payer: Medicare HMO

## 2021-05-10 ENCOUNTER — Ambulatory Visit: Payer: Medicare HMO

## 2021-05-11 ENCOUNTER — Other Ambulatory Visit: Payer: Self-pay | Admitting: *Deleted

## 2021-05-17 ENCOUNTER — Telehealth: Payer: Self-pay | Admitting: Pharmacist

## 2021-05-17 NOTE — Chronic Care Management (AMB) (Signed)
° ° °  Chronic Care Management Pharmacy Assistant   Name: Stephanie Hunt  MRN: [id number] DOB: 1934/12/19   Reason for Encounter: Hypertension Adherence Call    Recent office visits:  None  Recent consult visits:  None  Hospital visits:  None in previous 6 months  Medications: Outpatient Encounter Medications as of 05/17/2021  Medication Sig   AMBULATORY NON FORMULARY MEDICATION Walker with seat.  Disp 1.  Try using aerocare Sciatica M54.32   Calcium Carbonate-Vitamin D (CALTRATE 600+D PO) Take by mouth.   Multiple Vitamin (MULTIVITAMIN) tablet Take 1 tablet by mouth daily.   valsartan-hydrochlorothiazide (DIOVAN-HCT) 160-25 MG tablet Take 1 tablet by mouth daily.   No facility-administered encounter medications on file as of 05/17/2021.   Reviewed chart prior to disease state call. Spoke with patient regarding BP  Recent Office Vitals: BP Readings from Last 3 Encounters:  11/15/20 (!) 157/86  03/10/20 110/70  02/09/20 (!) 184/83   Pulse Readings from Last 3 Encounters:  11/15/20 (!) 58  03/10/20 75  02/09/20 (!) 59    Wt Readings from Last 3 Encounters:  11/15/20 137 lb 3.2 oz (62.2 kg)  03/10/20 135 lb 3.2 oz (61.3 kg)  02/04/20 138 lb (62.6 kg)     Kidney Function Lab Results  Component Value Date/Time   CREATININE 1.02 11/15/2020 08:51 AM   CREATININE 0.95 03/17/2019 09:58 AM   GFR 50.76 (L) 11/15/2020 08:51 AM    BMP Latest Ref Rng & Units 11/15/2020 03/17/2019 03/24/2018  Glucose 70 - 99 mg/dL 88 83 88  BUN 6 - 23 mg/dL 13 14 16   Creatinine 0.40 - 1.20 mg/dL 5.09 3.26  Sodium 135 - 145 mEq/L 140 142 141  Potassium 3.5 - 5.1 mEq/L 4.2 3.7 4.3  Chloride 96 - 112 mEq/L 101 100 103  CO2 19 - 32 mEq/L 31 31 32  Calcium 8.4 - 10.5 mg/dL 9.7 9.8 7.12    Current antihypertensive regimen:  Valsartan-Hydrochlorothiazide 160-25 mg daily  How often are you checking your Blood Pressure? weekly  Current home BP readings: 05/15/21 - 137/74  What recent  interventions/DTPs have been made by any provider to improve Blood Pressure control since last CPP Visit: No recent interventions or DTPs.  Any recent hospitalizations or ED visits since last visit with CPP? No  What diet changes have been made to improve Blood Pressure Control?  Patient states she watches what she eats.  What exercise is being done to improve your Blood Pressure Control?  Patient states she likes to walk around the house and get on a stationary bicycle.  Adherence Review: Is the patient currently on ACE/ARB medication? Yes Does the patient have >5 day gap between last estimated fill dates? No   Care Gaps: Medicare Annual Wellness: Due now - patient declines. Hemoglobin A1C: 5.9% on 11/15/2020 Colonoscopy: Aged out Dexa Scan: Completed Mammogram: Last completed 01/20/2017  Future Appointments  Date Time Provider Department Center  07/18/2021  7:30 AM [facility] DX DEXA 1 [facility]  08/06/2021  3:00 PM [facility] CCM PHARMACIST [facility]   Star Rating Drugs: Valsartan-HCTZ 160-25 mg 02/17/2021  April D Calhoun, [facility] Clinical Pharmacist Assistant [phone]

## 2021-06-01 ENCOUNTER — Ambulatory Visit: Payer: Self-pay | Admitting: Pharmacist

## 2021-06-01 DIAGNOSIS — I10 Essential (primary) hypertension: Secondary | ICD-10-CM

## 2021-06-01 DIAGNOSIS — M8580 Other specified disorders of bone density and structure, unspecified site: Secondary | ICD-10-CM

## 2021-06-01 NOTE — Progress Notes (Signed)
°  Chronic Care Management   Outreach Note  06/01/2021 Name: Stephanie Hunt MRN: [id number] DOB: 1934-10-14  Referred by: Ardith Dark, MD Reason for referral : No chief complaint on file.   Updated blood pressure in chart with patient-reported home BP:  BP Readings from Last 3 Encounters:  05/15/21 137/74  11/15/20 (!) 157/86  03/10/20 110/70   Patient Care Plan: CCM Pharmacy Care Plan     Problem Identified: HLD HTN Osteopenia   Priority: High     Long-Range Goal: Disease Management   Start Date: 08/23/2020  Expected End Date: 08/23/2021  Recent Progress: On track  Priority: High  Note:    Current Barriers:  Due for repeat DEXA - scheduled for October 2022    Hypertension (BP goal <140/90) -Controlled -Current treatment: Valsartan-HCTZ 160-25 mg once daily -Current home readings: 140s/80s  -Current dietary habits: cut down on portions, avoid sweets carbohydrates -Current exercise habits: walking daily -Denies hypotensive/hypertensive symptoms -Educated on Daily salt intake goal < 2300 mg; Exercise goal of 150 minutes per week; -Counseled to monitor BP at home, document, and provide log at future appointments -Recommended to continue current medication  Update 01/30/21 BP this morning was 130/77!! Denies any dizziness or HA's - states BP remains around this all of the time.  No changes to medications needed at this time.  Osteopenia (Goal ensure DEXA f/u) -Controlled -Last DEXA Scan: 04/01/2018   T-Score femoral neck: RFN/LFN -2.2/-2.2  T-Score total hip: n/a  T-Score lumbar spine: -1.4  T-Score forearm radius: n/a  10-year probability of major osteoporotic fracture: 11.7%  10-year probability of hip fracture: 3.4% -Patient is willing to get repeat DEXA however due to recent deaths in the family is opting to hold off for the time being and will call the office if this is something she wishes to pursue in near future. -Current treatment  Calcium/vitamin  d3 supplementation -Medications previously tried: none on file   -Recommend [phone] units of vitamin D daily. Recommend 1200 mg of calcium daily from dietary and supplemental sources. Recommend weight-bearing and muscle strengthening exercises for building and maintaining bone density. -Counseled on diet and exercise extensively Recommended to continue current medication  Update Reminded patient of her upcoming bone density scan on 03/23/21. Will evaluate next steps after that date  Patient Goals/Self-Care Activities Over the next 365 days, patient will:  - take medications as prescribed target a minimum of 150 minutes of moderate intensity exercise weekly  Medication Assistance: None needed Patient's preferred pharmacy is:  [facility] [address] [place] - [address] [place] [address] Phone: [phone] Fax: [phone]        Willa Frater, PharmD Clinical Pharmacist  [facility] [phone]

## 2021-07-16 ENCOUNTER — Other Ambulatory Visit: Payer: Self-pay

## 2021-07-16 ENCOUNTER — Ambulatory Visit (INDEPENDENT_AMBULATORY_CARE_PROVIDER_SITE_OTHER): Payer: Medicare PPO | Admitting: Family Medicine

## 2021-07-16 VITALS — BP 149/83 | HR 73 | Temp 97.9°F | Ht 63.0 in | Wt 133.4 lb

## 2021-07-16 DIAGNOSIS — R42 Dizziness and giddiness: Secondary | ICD-10-CM | POA: Diagnosis not present

## 2021-07-16 DIAGNOSIS — E785 Hyperlipidemia, unspecified: Secondary | ICD-10-CM | POA: Diagnosis not present

## 2021-07-16 DIAGNOSIS — I10 Essential (primary) hypertension: Secondary | ICD-10-CM | POA: Diagnosis not present

## 2021-07-16 NOTE — Patient Instructions (Signed)
It was very nice to see you today!  Your EKG today is normal.  You may have had a pinched nerve.  We will check blood work today.  Please let me know if your symptoms return.  Take care, Dr Jimmey Ralph  PLEASE NOTE:  If you had any lab tests please let us know if you have not heard back within a few days. You may see your results on mychart before we have a chance to review them but we will give you a call once they are reviewed by [place]. If we ordered any referrals today, please let us know if you have not heard from their office within the next week.   Please try these tips to maintain a healthy lifestyle:  Eat at least 3 REAL meals and 1-2 snacks per day.  Aim for no more than 5 hours between eating.  If you eat breakfast, please do so within one hour of getting up.   Each meal should contain half fruits/vegetables, one quarter protein, and one quarter carbs (no bigger than a computer mouse)  Cut down on sweet beverages. This includes juice, soda, and sweet tea.   Drink at least 1 glass of water with each meal and aim for at least 8 glasses per day  Exercise at least 150 minutes every week.

## 2021-07-16 NOTE — Assessment & Plan Note (Signed)
At goal per JNC 8 on valsartan-HCTZ 160-25 once daily.

## 2021-07-16 NOTE — Progress Notes (Signed)
° °  Stephanie Hunt is a 87 y.o. female who presents today for an office visit.  Assessment/Plan:  New/Acute Problems: Paresthesias No red flags.  It is reassuring that symptoms have resolved.  Could have had a mild compressive neuropathy that has resolved.  We will check labs today.  EKG is reassuring.  She does not have any other signs or symptoms concerning for cardiac illness.  Reassured patient. We will continue with watchful waiting.  Encourage good hydration.  We discussed reasons to return to care.  Chronic Problems Addressed Today: Hypertension At goal per JNC 8 on valsartan-HCTZ 160-25 once daily.   Hyperlipidemia Check labs.      Subjective:  HPI:  Patient here with an episode of tingling in her left hand.  This started 2 days ago.  Lasted for about a day and then subsided.  She had some associated lightheadedness.  No chest pain or shortness of breath.  Symptoms resolved on their own.  She is worried about possible circulation issue.  Symptoms were very mild. No weakness.  She is going on a trip to [place] tomorrow and like to be checked before going.  She has been compliant with her medications without side effects.  No current symptoms.       Objective:  Physical Exam: BP (!) 149/83 (BP Location: Left Arm)    Pulse 73    Temp 97.9 F (36.6 C) (Temporal)    Ht 5\' 3"  (1.6 m)    Wt 133 lb 6.4 oz (60.5 kg)    LMP  (LMP Unknown)    SpO2 97%    BMI 23.63 kg/m   Gen: No acute distress, resting comfortably CV: Regular rate and rhythm with no murmurs appreciated Pulm: Normal work of breathing, clear to auscultation bilaterally with no crackles, wheezes, or rhonchi MSK: Bilateral hands without deformities.  Strength 5 out of 5 throughout.  Tinel's sign and Phalen's test negative bilaterally.  Neurovascular intact distally. Neuro: Grossly normal, moves all extremities Psych: Normal affect and thought content  EKG: NSR. No ischemic changes.       . [name], MD 07/16/2021 4:10  PM

## 2021-07-16 NOTE — Assessment & Plan Note (Signed)
Check labs 

## 2021-07-17 LAB — CBC
HCT: 41.6 % (ref 36.0–46.0)
Hemoglobin: 13.7 g/dL (ref 12.0–15.0)
MCHC: 33 g/dL (ref 30.0–36.0)
MCV: 89.4 fl (ref 78.0–100.0)
Platelets: 264 10*3/uL (ref 150.0–400.0)
RBC: 4.65 Mil/uL (ref 3.87–5.11)
RDW: 14.8 % (ref 11.5–15.5)
WBC: 7.2 10*3/uL (ref 4.0–10.5)

## 2021-07-17 LAB — TSH: TSH: 1.33 u[IU]/mL (ref 0.35–5.50)

## 2021-07-18 ENCOUNTER — Other Ambulatory Visit: Payer: Medicare HMO

## 2021-07-18 LAB — COMPREHENSIVE METABOLIC PANEL
ALT: 15 U/L (ref 0–35)
AST: 21 U/L (ref 0–37)
Albumin: 4.1 g/dL (ref 3.5–5.2)
Alkaline Phosphatase: 114 U/L (ref 39–117)
BUN: 14 mg/dL (ref 6–23)
CO2: 29 mEq/L (ref 19–32)
Calcium: 9.5 mg/dL (ref 8.4–10.5)
Chloride: 102 mEq/L (ref 96–112)
Creatinine, Ser: 0.93 mg/dL (ref 0.40–1.20)
GFR: 56.45 mL/min — ABNORMAL LOW (ref >60.00)
Glucose, Bld: 86 mg/dL (ref 70–99)
Potassium: 4.1 mEq/L (ref 3.5–5.1)
Sodium: 140 mEq/L (ref 135–145)
Total Bilirubin: 0.3 mg/dL (ref 0.2–1.2)
Total Protein: 7.3 g/dL (ref 6.0–8.3)

## 2021-07-18 LAB — LIPID PANEL
Cholesterol: 233 mg/dL — ABNORMAL HIGH (ref 0–200)
HDL: 69 mg/dL (ref >39.00)
LDL Cholesterol: 135 mg/dL — ABNORMAL HIGH (ref 0–99)
NonHDL: 164.25
Total CHOL/HDL Ratio: 3
Triglycerides: 148 mg/dL (ref 0.0–149.0)
VLDL: 29.6 mg/dL (ref 0.0–40.0)

## 2021-07-19 NOTE — Progress Notes (Signed)
Please inform patient of the following:  Her cholesterol is borderline but stable. Everything else is NORMAL. Do not need to make any changes to her treatment plan at this time. She should keep working on diet and exercise and we can recheck in a year or so.  [name]. Jimmey Ralph, MD 07/19/2021 9:20 AM

## 2021-08-03 NOTE — Progress Notes (Signed)
Chronic Care Management Pharmacy Note  08/06/2021 Name:  Stephanie Hunt MRN:  [id number] DOB:  02/06/1935  Subjective: Stephanie Hunt is an 87 y.o. year old female who is a primary patient of Jerline Pain, Algis Greenhouse, MD.  The CCM team was consulted for assistance with disease management and care coordination needs.    Engaged with patient by telephone for follow up visit in response to provider referral for pharmacy case management and/or care coordination services.   Consent to Services:  The patient was given information about Chronic Care Management services, agreed to services, and gave verbal consent prior to initiation of services.  Please see initial visit note for detailed documentation.   Patient Care Team: Vivi Barrack, MD as PCP - General (Family Medicine) Juluis Rainier as Consulting Physician (Optometry) Edythe Clarity, [facility] (Pharmacist)  Objective: Lab Results  Component Value Date   CREATININE 0.93 07/16/2021   CREATININE 1.02 11/15/2020   CREATININE 0.95 03/17/2019   GFR 56.45 (L) 07/16/2021   GFR 50.76 (L) 11/15/2020  Last diabetic Eye exam: No results found for: HMDIABEYEEXA  Last diabetic Foot exam: No results found for: HMDIABFOOTEX  Lab Results  Component Value Date   CHOL 233 (H) 07/16/2021   CHOL 241 (H) 11/15/2020   TRIG 148.0 07/16/2021   TRIG 93.0 11/15/2020   HDL 69.00 07/16/2021   HDL 70.00 11/15/2020   CHOLHDL 3 07/16/2021   CHOLHDL 3 11/15/2020   VLDL 29.6 07/16/2021   VLDL 18.6 11/15/2020   LDLCALC 135 (H) 07/16/2021   LDLCALC 152 (H) 11/15/2020   Hepatic Function Latest Ref Rng & Units 07/16/2021 11/15/2020 03/17/2019  Total Protein 6.0 - 8.3 g/dL 7.3 7.4 6.8  Albumin 3.5 - 5.2 g/dL 4.1 4.2 4.2  AST 0 - 37 U/L 21 19 18   ALT 0 - 35 U/L 15 14 12   Alk Phosphatase 39 - 117 U/L 114 94 87  Total Bilirubin 0.2 - 1.2 mg/dL 0.3 0.5 0.6   Lab Results  Component Value Date/Time   TSH 1.33 07/16/2021 04:11 PM   TSH 1.41 11/15/2020 08:51 AM   CBC Latest  Ref Rng & Units 07/16/2021 11/15/2020 03/17/2019  WBC 4.0 - 10.5 K/uL 7.2 6.7 5.9  Hemoglobin 12.0 - 15.0 g/dL 13.7 14.3 13.8  Hematocrit 36.0 - 46.0 % 41.6 42.1 41.8  Platelets 150.0 - 400.0 K/uL 264.0 281.0 248.0   Lab Results  Component Value Date/Time   VD25OH 47.49 03/17/2019 09:58 AM    Clinical ASCVD: No  The ASCVD Risk score (Arnett DK, et al., 2019) failed to calculate for the following reasons:   The 2019 ASCVD risk score is only valid for ages 48 to 34    Social History   Tobacco Use  Smoking Status Never  Smokeless Tobacco Never   BP Readings from Last 3 Encounters:  08/06/21 133/79  07/16/21 (!) 149/83  05/15/21 137/74   Pulse Readings from Last 3 Encounters:  07/16/21 73  11/15/20 (!) 58  03/10/20 75   Wt Readings from Last 3 Encounters:  07/16/21 133 lb 6.4 oz (60.5 kg)  11/15/20 137 lb 3.2 oz (62.2 kg)  03/10/20 135 lb 3.2 oz (61.3 kg)    Assessment: Review of patient past medical history, allergies, medications, health status, including review of consultants reports, laboratory and other test data, was performed as part of comprehensive evaluation and provision of chronic care management services.   SDOH:  (Social Determinants of Health) assessments and interventions performed: Yes  [place]  Allergies  Allergen Reactions   Penicillins Rash   Medications Reviewed Today     Reviewed by Edythe Clarity, [facility] (Pharmacist) on 08/06/21 at 1531  Med List Status: <None>   Medication Order Taking? Sig Documenting Provider Last Dose Status Informant  AMBULATORY NON FORMULARY MEDICATION [id number] Yes Walker with seat.  Disp 1.  Try using aerocare Sciatica M54.32 Gregor Hams, MD Taking Active   Calcium Carbonate-Vitamin D (CALTRATE 600+D PO) [id number] Yes Take by mouth. [provider] Taking Active   Multiple Vitamin (MULTIVITAMIN) tablet [id number] Yes Take 1 tablet by mouth daily. [provider] Taking Active    valsartan-hydrochlorothiazide (DIOVAN-HCT) 160-25 MG tablet [id number] Yes Take 1 tablet by mouth daily. Vivi Barrack, MD Taking Active            Patient Active Problem List   Diagnosis Date Noted   Allergic rhinitis 11/15/2020   Trochanteric bursitis of left hip 03/10/2020   Sciatica, left side 12/15/2019   Hypertension 04/15/2017   Hyperlipidemia 04/15/2017   Osteopenia 04/15/2017   Immunization History  Administered Date(s) Administered   Fluad Quad(high Dose 65+) 03/17/2019, 03/22/2020   Influenza, High Dose Seasonal PF 03/24/2018   Influenza-Unspecified 02/21/2021   PFIZER(Purple Top)SARS-COV-2 Vaccination 07/10/2019, 07/31/2019, 04/12/2020   PNEUMOCOCCAL CONJUGATE-20 11/15/2020   Zoster Recombinat (Shingrix) 03/17/2019, 07/02/2019    Conditions to be addressed/monitored: HTN, HLD and Osteopenia  Care Plan : [place]  Updates made by Edythe Clarity, RPH since 08/06/2021 12:00 AM     Problem: HLD HTN Osteopenia   Priority: High     Long-Range Goal: Disease Management   Start Date: 08/23/2020  Expected End Date: 08/23/2021  Recent Progress: On track  Priority: High  Note:    Hypertension (BP goal <140/90) -Controlled -Current treatment: Valsartan-HCTZ 160-25 mg once daily Appropriate, Effective, Safe, Accessible -Current home readings: 140s/80s  -Current dietary habits: cut down on portions, avoid sweets carbohydrates -Current exercise habits: walking daily -Denies hypotensive/hypertensive symptoms -Educated on Daily salt intake goal < 2300 mg; Exercise goal of 150 minutes per week; -Counseled to monitor BP at home, document, and provide log at future appointments -Recommended to continue current medication  Update 01/30/21 BP this morning was 130/77!! Denies any dizziness or HA's - states BP remains around this all of the time. No changes to medications needed at this time.  Update 08/06/21 133/79 home BP reading today! Denies any  dizziness or HA - continues active lifestyle walking and riding her stationary bike. No changes at this time.  Osteopenia (Goal ensure DEXA f/u) -Controlled -Last DEXA Scan: 04/01/2018   T-Score femoral neck: RFN/LFN -2.2/-2.2  T-Score total hip: n/a  T-Score lumbar spine: -1.4  T-Score forearm radius: n/a  10-year probability of major osteoporotic fracture: 11.7%  10-year probability of hip fracture: 3.4% -Patient is willing to get repeat DEXA however due to recent deaths in the family is opting to hold off for the time being and will call the office if this is something she wishes to pursue in near future. -Current treatment  Calcium/vitamin d3 supplementation Appropriate, Query effective, , -Medications previously tried: none on file   -Recommend [phone] units of vitamin D daily. Recommend 1200 mg of calcium daily from dietary and supplemental sources. Recommend weight-bearing and muscle strengthening exercises for building and maintaining bone density. -Counseled on diet and exercise extensively Recommended to continue current medication  Reminded patient of her upcoming bone density scan on 03/23/21. Will evaluate next steps after that date  Update 08/06/21 She was out of town and had to reschedule her DEXA scan in October of last year.  We discussed importance of this and she agreed to reschedule for another DEXA.  Recommended she resume her calcium and vitamin D supplements.  FU after DEXA to ensure treatment if necessary.   No further changes needed at this time.  Patient Goals/Self-Care Activities Over the next 365 days, patient will:  - take medications as prescribed target a minimum of 150 minutes of moderate intensity exercise weekly  Medication Assistance: None needed Patient's preferred pharmacy is:  [facility] [address] [place] - [place] [address] [place] [address] Phone: [phone] Fax: [phone]            Current Barriers:  Due for repeat DEXA  Pharmacist Clinical Goal(s):  Over the next 365 days, patient will contact provider office for questions/concerns as evidenced notation of same in electronic health record through collaboration with PharmD and provider.   Interventions: 1:1 collaboration with Vivi Barrack, MD regarding development and update of comprehensive plan of care as evidenced by provider attestation and co-signature Inter-disciplinary care team collaboration (see longitudinal plan of care) Comprehensive medication review performed; medication list updated in electronic medical record Follow Up:  Patient agrees to Care Plan and Follow-up. Plan: 6 month FU No future appointments.  Beverly Milch, PharmD Clinical Pharmacist  [facility] [phone]

## 2021-08-06 ENCOUNTER — Ambulatory Visit (INDEPENDENT_AMBULATORY_CARE_PROVIDER_SITE_OTHER): Payer: Medicare PPO | Admitting: Pharmacist

## 2021-08-06 VITALS — BP 133/79

## 2021-08-06 DIAGNOSIS — M8580 Other specified disorders of bone density and structure, unspecified site: Secondary | ICD-10-CM

## 2021-08-06 DIAGNOSIS — I10 Essential (primary) hypertension: Secondary | ICD-10-CM

## 2021-08-06 NOTE — Patient Instructions (Addendum)
Visit Information ? ? Goals Addressed   ? ?  ?  ?  ?  ? This Visit's Progress  ?  Track and Manage My Blood Pressure-Hypertension     ?  Timeframe:  Long-Range Goal ?Priority:  High ?Start Date:  08/06/21                           ?Expected End Date:   02/06/22                   ? ?Follow Up Date 11/06/21  ?  ?- check blood pressure weekly ?- choose a place to take my blood pressure (home, clinic or office, retail store)  ?  ?Why is this important?   ?You won't feel high blood pressure, but it can still hurt your blood vessels.  ?High blood pressure can cause heart or kidney problems. It can also cause a stroke.  ?Making lifestyle changes like losing a little weight or eating less salt will help.  ?Checking your blood pressure at home and at different times of the day can help to control blood pressure.  ?If the doctor prescribes medicine remember to take it the way the doctor ordered.  ?Call the office if you cannot afford the medicine or if there are questions about it.   ?  ?Notes:  ?  ? ?  ? ?Patient Care Plan: CCM Pharmacy Care Plan  ?  ? ?Problem Identified: HLD HTN Osteopenia   ?Priority: High  ?  ? ?Long-Range Goal: Disease Management   ?Start Date: 08/23/2020  ?Expected End Date: 08/23/2021  ?Recent Progress: On track  ?Priority: High  ?Note:   ? ?Hypertension (BP goal <140/90) ?-Controlled ?-Current treatment: ?Valsartan-HCTZ 160-25 mg once daily Appropriate, Effective, Safe, Accessible ?-Current home readings: 140s/80s  ?-Current dietary habits: cut down on portions, avoid sweets carbohydrates ?-Current exercise habits: walking daily ?-Denies hypotensive/hypertensive symptoms ?-Educated on Daily salt intake goal < 2300 mg; ?Exercise goal of 150 minutes per week; ?-Counseled to monitor BP at home, document, and provide log at future appointments ?-Recommended to continue current medication ? ?Update 01/30/21 ?BP this morning was 130/77!! ?Denies any dizziness or HA's - states BP remains around this all of  the time. ?No changes to medications needed at this time. ? ?Update 08/06/21 ?133/79 home BP reading today! ?Denies any dizziness or HA - continues active lifestyle walking and riding her stationary bike. ?No changes at this time. ? ?Osteopenia (Goal ensure DEXA f/u) ?-Controlled ?-Last DEXA Scan: 04/01/2018  ? T-Score femoral neck: RFN/LFN -2.2/-2.2 ? T-Score total hip: n/a ? T-Score lumbar spine: -1.4 ? T-Score forearm radius: n/a ? 10-year probability of major osteoporotic fracture: 11.7% ? 10-year probability of hip fracture: 3.4% ?-Patient is willing to get repeat DEXA however due to recent deaths in the family is opting to hold off for the time being and will call the office if this is something she wishes to pursue in near future. ?-Current treatment  ?Calcium/vitamin d3 supplementation Appropriate, Query effective, , ?-Medications previously tried: none on file   ?-Recommend [phone] units of vitamin D daily. Recommend 1200 mg of calcium daily from dietary and supplemental sources. Recommend weight-bearing and muscle strengthening exercises for building and maintaining bone density. ?-Counseled on diet and exercise extensively ?Recommended to continue current medication ? ?Reminded patient of her upcoming bone density scan on 03/23/21. ?Will evaluate next steps after that date ? ?Update 08/06/21 ?She was out of town  and had to reschedule her DEXA scan in October of last year.  We discussed importance of this and she agreed to reschedule for another DEXA.  Recommended she resume her calcium and vitamin D supplements.  ?FU after DEXA to ensure treatment if necessary.   ?No further changes needed at this time. ? ?Patient Goals/Self-Care Activities ?Over the next 365 days, patient will:  ?- take medications as prescribed ?target a minimum of 150 minutes of moderate intensity exercise weekly ? ?Medication Assistance: None needed ?Patient's preferred pharmacy is: ? ?[facility] [address], [place] - [address] ?[address] ?[place] [address] ?Phone: [phone] Fax: [phone] ? ? ?  ? ?  ?  ? ?The patient verbalized understanding of instructions, educational materials, and care plan provided today and declined offer to receive copy of patient instructions, educational materials, and care plan.  ?Telephone follow up appointment with pharmacy team member scheduled for: Patient to be enrolled in surveilance ? ?Erroll Luna, [facility]  ?Willa Frater, PharmD ?Clinical Pharmacist  ?Romie Jumper ?([phone] ? ?

## 2021-08-14 ENCOUNTER — Other Ambulatory Visit: Payer: Self-pay | Admitting: Family Medicine

## 2021-08-14 DIAGNOSIS — M8580 Other specified disorders of bone density and structure, unspecified site: Secondary | ICD-10-CM

## 2021-09-20 ENCOUNTER — Ambulatory Visit (INDEPENDENT_AMBULATORY_CARE_PROVIDER_SITE_OTHER): Payer: Medicare PPO

## 2021-09-20 DIAGNOSIS — Z0000 Encounter for general adult medical examination without abnormal findings: Secondary | ICD-10-CM | POA: Diagnosis not present

## 2021-09-20 NOTE — Progress Notes (Addendum)
Virtual Visit via Telephone Note ? ?I connected with  Stephanie Hunt on 09/20/21 at  1:30 PM EDT by telephone and verified that I am speaking with the correct person using two identifiers. ? ?Medicare Annual Wellness visit completed telephonically due to Covid-19 pandemic.  ? ?Persons participating in this call: This Health Coach and this patient.  ? ?Location: ?Patient: Home ?Provider: Office ?  ?I discussed the limitations, risks, security and privacy concerns of performing an evaluation and management service by telephone and the availability of in person appointments. The patient expressed understanding and agreed to proceed. ? ?Unable to perform video visit due to video visit attempted and failed and/or patient does not have video capability.  ? ?Some vital signs may be absent or patient reported.  ? ?Stephanie Schlein, LPN ? ? ?Subjective:  ? Stephanie Hunt is a 87 y.o. female who presents for Medicare Annual (Subsequent) preventive examination. ? ?Review of Systems    ? ?Cardiac Risk Factors include: advanced age (>9men, >64 women);hypertension;dyslipidemia ? ?   ?Objective:  ?  ?There were no vitals filed for this visit. ?There is no height or weight on file to calculate BMI. ? ? ?  09/20/2021  ?  1:32 PM 04/20/2020  ?  9:40 AM 03/17/2019  ? 10:24 AM 08/21/2017  ? 10:59 AM  ?Advanced Directives  ?Does Patient Have a Medical Advance Directive? Yes Yes Yes Yes  ?Type of Advance Directive Living will Healthcare Power of [place];Living will Healthcare Power of [place];Living will   ?Does patient want to make changes to medical advance directive?   No - Patient declined   ?Copy of Healthcare Power of Attorney in Chart?  No - copy requested No - copy requested   ? ? ?Current Medications (verified) ?Outpatient Encounter Medications as of 09/20/2021  ?Medication Sig  ? Calcium Carbonate-Vitamin D (CALTRATE 600+D PO) Take by mouth.  ? Multiple Vitamin (MULTIVITAMIN) tablet Take 1 tablet by mouth daily.  ?  valsartan-hydrochlorothiazide (DIOVAN-HCT) 160-25 MG tablet Take 1 tablet by mouth daily.  ? AMBULATORY NON FORMULARY MEDICATION Walker with seat.  ?Disp 1.  ?Try using aerocare ?Sciatica M54.32  ? ?No facility-administered encounter medications on file as of 09/20/2021.  ? ? ?Allergies (verified) ?Penicillins  ? ?History: ?Past Medical History:  ?Diagnosis Date  ? Hyperlipidemia   ? Hypertension   ? ?Past Surgical History:  ?Procedure Laterality Date  ? WRIST FRACTURE SURGERY Bilateral   ? ?Family History  ?Problem Relation Age of Onset  ? Cancer Mother   ?     Uterus  ? Hypertension Father   ? ?Social History  ? ?Socioeconomic History  ? Marital status: Widowed  ?  Spouse name: Not on file  ? Number of children: Not on file  ? Years of education: Not on file  ? Highest education level: Not on file  ?Occupational History  ? Occupation: retired  ?Tobacco Use  ? Smoking status: Never  ? Smokeless tobacco: Never  ?Vaping Use  ? Vaping Use: Never used  ?Substance and Sexual Activity  ? Alcohol use: No  ? Drug use: No  ? Sexual activity: Never  ?Other Topics Concern  ? Not on file  ?Social History Narrative  ? Moved to area from [name] in 2018  ? ?Social Determinants of Health  ? ?Financial Resource Strain: Low Risk   ? Difficulty of Paying Living Expenses: Not hard at all  ?Food Insecurity: No Food Insecurity  ? Worried About Programme researcher, broadcasting/film/video in  the Last Year: Never true  ? Ran Out of Food in the Last Year: Never true  ?Transportation Needs: No Transportation Needs  ? Lack of Transportation (Medical): No  ? Lack of Transportation (Non-Medical): No  ?Physical Activity: Sufficiently Active  ? Days of Exercise per Week: 4 days  ? Minutes of Exercise per Session: 60 min  ?Stress: No Stress Concern Present  ? Feeling of Stress : Not at all  ?Social Connections: Moderately Isolated  ? Frequency of Communication with Friends and Family: More than three times a week  ? Frequency of Social Gatherings with Friends and Family: More  than three times a week  ? Attends Religious Services: 1 to 4 times per year  ? Active Member of Clubs or Organizations: No  ? Attends Archivist Meetings: Never  ? Marital Status: Widowed  ? ? ?Tobacco Counseling ?Counseling given: Not Answered ? ? ?Clinical Intake: ? ?Pre-visit preparation completed: Yes ? ?Pain : No/denies pain ? ?  ? ?BMI - recorded: 23.64 ?Nutritional Status: BMI of 19-24  Normal ?Nutritional Risks: None ?Diabetes: No ? ?How often do you need to have someone help you when you read instructions, pamphlets, or other written materials from your doctor or pharmacy?: 1 - Never ? ?Diabetic?No ? ?Interpreter Needed?: No ? ?Information entered by :: Charlott Rakes, LPN ? ? ?Activities of Daily Living ? ?  09/20/2021  ?  1:33 PM  ?In your present state of health, do you have any difficulty performing the following activities:  ?Hearing? 0  ?Vision? 0  ?Difficulty concentrating or making decisions? 0  ?Walking or climbing stairs? 0  ?Dressing or bathing? 0  ?Doing errands, shopping? 0  ?Preparing Food and eating ? N  ?Using the Toilet? N  ?In the past six months, have you accidently leaked urine? N  ?Do you have problems with loss of bowel control? N  ?Managing your Medications? N  ?Managing your Finances? N  ?Housekeeping or managing your Housekeeping? N  ? ? ?Patient Care Team: ?Vivi Barrack, MD as PCP - General (Family Medicine) ?Juluis Rainier as Consulting Physician (Optometry) ?Edythe Clarity, [facility] (Pharmacist) ? ?Indicate any recent Medical Services you may have received from other than [facility] providers in the past year (date may be approximate). ? ?   ?Assessment:  ? This is a routine wellness examination for Stephanie Hunt. ? ?Hearing/Vision screen ?Hearing Screening - Comments:: Pt denies any hearing issues  ?Vision Screening - Comments:: Pt follows up with Dr Idolina Primer for annual eye exams  ? ?Dietary issues and exercise activities discussed: ?Current Exercise Habits: Home exercise routine, Type  of exercise: walking;Other - see comments, Time (Minutes): 60, Frequency (Times/Week): 4, Weekly Exercise (Minutes/Week): 240 ? ? Goals Addressed   ? ?  ?  ?  ?  ? This Visit's Progress  ?  Patient Stated     ?  Keep active  ?  ? ?  ? ?Depression Screen ? ?  09/20/2021  ?  1:32 PM 11/15/2020  ?  8:25 AM 04/20/2020  ?  9:38 AM 03/17/2019  ? 10:24 AM 03/17/2019  ?  9:15 AM 08/21/2017  ? 11:01 AM 04/15/2017  ?  2:58 PM  ?PHQ 2/9 Scores  ?PHQ - 2 Score 0 0 0 0 0 0 0  ?PHQ- 9 Score  0       ?  ?Fall Risk ? ?  09/20/2021  ?  1:33 PM 11/15/2020  ?  8:25 AM 04/20/2020  ?  9:41 AM 12/01/2019  ? 12:30 PM 03/17/2019  ? 10:24 AM  ?Fall Risk   ?Falls in the past year? 0 0 0 0 0  ?Number falls in past yr: 0  0 0   ?Injury with Fall? 0  0 0 0  ?Risk for fall due to : Impaired vision  Impaired vision;Impaired balance/gait;Impaired mobility    ?Follow up Falls prevention discussed  Falls prevention discussed  Education provided;Falls prevention discussed;Falls evaluation completed  ? ? ?FALL RISK PREVENTION PERTAINING TO THE HOME: ? ?Any stairs in or around the home? Yes  ?If so, are there any without handrails? No  ?Home free of loose throw rugs in walkways, pet beds, electrical cords, etc? Yes  ?Adequate lighting in your home to reduce risk of falls? Yes  ? ?ASSISTIVE DEVICES UTILIZED TO PREVENT FALLS: ? ?Life alert? No  ?Use of a cane, walker or w/c? No  ?Grab bars in the bathroom? No  ?Shower chair or bench in shower? No  ?Elevated toilet seat or a handicapped toilet? No  ? ?TIMED UP AND GO: ? ?Was the test performed? No .  ?Cognitive Function: ?  ?  ? ?  09/20/2021  ?  1:36 PM 04/20/2020  ?  9:43 AM 03/17/2019  ? 10:25 AM  ?6CIT Screen  ?What Year? 0 points 0 points 0 points  ?What month? 0 points 0 points 0 points  ?What time? 0 points  0 points  ?Count back from 20 0 points 0 points 0 points  ?Months in reverse 0 points 0 points 0 points  ?Repeat phrase 0 points 0 points 0 points  ?Total Score 0 points  0 points   ? ? ?Immunizations ?Immunization History  ?Administered Date(s) Administered  ? Fluad Quad(high Dose 65+) 03/17/2019, 03/22/2020  ? Influenza, High Dose Seasonal PF 03/24/2018  ? Influenza-Unspecified 02/21/2021  ? PFIZER(Purple Top)SA

## 2021-09-20 NOTE — Patient Instructions (Addendum)
Ms. Kender , ?Thank you for taking time to come for your Medicare Wellness Visit. I appreciate your ongoing commitment to your health goals. Please review the following plan we discussed and let me know if I can assist you in the future.  ? ?Screening recommendations/referrals: ?Colonoscopy: No longer required  ?Mammogram: No longer required  ?Bone Density: Scheduled for 02/11/22  ?Recommended yearly ophthalmology/optometry visit for glaucoma screening and checkup ?Recommended yearly dental visit for hygiene and checkup ? ?Vaccinations: ?Influenza vaccine: Done 02/21/21 ?Pneumococcal vaccine: Up to date ?Tdap vaccine: Done 01/31/17 repeat every 10 years  ?Shingles vaccine: Completed 03/17/19 & 07/02/19   ?Covid-19:Completed 2/6, 2/27, 04/12/20 ? ?Advanced directives: Please bring a copy of your health care power of attorney and living will to the office at your convenience. ? ? ?Conditions/risks identified: Stay Active  ? ?Next appointment: Follow up in one year for your annual wellness visit  ? ? ?Preventive Care 37 Years and Older, Female ?Preventive care refers to lifestyle choices and visits with your health care provider that can promote health and wellness. ?What does preventive care include? ?A yearly physical exam. This is also called an annual well check. ?Dental exams once or twice a year. ?Routine eye exams. Ask your health care provider how often you should have your eyes checked. ?Personal lifestyle choices, including: ?Daily care of your teeth and gums. ?Regular physical activity. ?Eating a healthy diet. ?Avoiding tobacco and drug use. ?Limiting alcohol use. ?Practicing safe sex. ?Taking low-dose aspirin every day. ?Taking vitamin and mineral supplements as recommended by your health care provider. ?What happens during an annual well check? ?The services and screenings done by your health care provider during your annual well check will depend on your age, overall health, lifestyle risk factors, and family  history of disease. ?Counseling  ?Your health care provider may ask you questions about your: ?Alcohol use. ?Tobacco use. ?Drug use. ?Emotional well-being. ?Home and relationship well-being. ?Sexual activity. ?Eating habits. ?History of falls. ?Memory and ability to understand (cognition). ?Work and work Statistician. ?Reproductive health. ?Screening  ?You may have the following tests or measurements: ?Height, weight, and BMI. ?Blood pressure. ?Lipid and cholesterol levels. These may be checked every 5 years, or more frequently if you are over 64 years old. ?Skin check. ?Lung cancer screening. You may have this screening every year starting at age 51 if you have a 30-pack-year history of smoking and currently smoke or have quit within the past 15 years. ?Fecal occult blood test (FOBT) of the stool. You may have this test every year starting at age 71. ?Flexible sigmoidoscopy or colonoscopy. You may have a sigmoidoscopy every 5 years or a colonoscopy every 10 years starting at age 1. ?Hepatitis C blood test. ?Hepatitis B blood test. ?Sexually transmitted disease (STD) testing. ?Diabetes screening. This is done by checking your blood sugar (glucose) after you have not eaten for a while (fasting). You may have this done every 1-3 years. ?Bone density scan. This is done to screen for osteoporosis. You may have this done starting at age 66. ?Mammogram. This may be done every 1-2 years. Talk to your health care provider about how often you should have regular mammograms. ?Talk with your health care provider about your test results, treatment options, and if necessary, the need for more tests. ?Vaccines  ?Your health care provider may recommend certain vaccines, such as: ?Influenza vaccine. This is recommended every year. ?Tetanus, diphtheria, and acellular pertussis (Tdap, Td) vaccine. You may need a Td booster every  10 years. ?Zoster vaccine. You may need this after age 32. ?Pneumococcal 13-valent conjugate (PCV13)  vaccine. One dose is recommended after age 72. ?Pneumococcal polysaccharide (PPSV23) vaccine. One dose is recommended after age 14. ?Talk to your health care provider about which screenings and vaccines you need and how often you need them. ?This information is not intended to replace advice given to you by your health care provider. Make sure you discuss any questions you have with your health care provider. ?Document Released: 06/16/2015 Document Revised: 02/07/2016 Document Reviewed: 03/21/2015 ?Elsevier Interactive Patient Education ? 2017 [place]. ? ?Fall Prevention in the Home ?Falls can cause injuries. They can happen to people of all ages. There are many things you can do to make your home safe and to help prevent falls. ?What can I do on the outside of my home? ?Regularly fix the edges of walkways and driveways and fix any cracks. ?Remove anything that might make you trip as you walk through a door, such as a raised step or threshold. ?Trim any bushes or trees on the path to your home. ?Use bright outdoor lighting. ?Clear any walking paths of anything that might make someone trip, such as rocks or tools. ?Regularly check to see if handrails are loose or broken. Make sure that both sides of any steps have handrails. ?Any raised decks and porches should have guardrails on the edges. ?Have any leaves, snow, or ice cleared regularly. ?Use sand or salt on walking paths during winter. ?Clean up any spills in your garage right away. This includes oil or grease spills. ?What can I do in the bathroom? ?Use night lights. ?Install grab bars by the toilet and in the tub and shower. Do not use towel bars as grab bars. ?Use non-skid mats or decals in the tub or shower. ?If you need to sit down in the shower, use a plastic, non-slip stool. ?Keep the floor dry. Clean up any water that spills on the floor as soon as it happens. ?Remove soap buildup in the tub or shower regularly. ?Attach bath mats securely with  double-sided non-slip rug tape. ?Do not have throw rugs and other things on the floor that can make you trip. ?What can I do in the bedroom? ?Use night lights. ?Make sure that you have a light by your bed that is easy to reach. ?Do not use any sheets or blankets that are too big for your bed. They should not hang down onto the floor. ?Have a firm chair that has side arms. You can use this for support while you get dressed. ?Do not have throw rugs and other things on the floor that can make you trip. ?What can I do in the kitchen? ?Clean up any spills right away. ?Avoid walking on wet floors. ?Keep items that you use a lot in easy-to-reach places. ?If you need to reach something above you, use a strong step stool that has a grab bar. ?Keep electrical cords out of the way. ?Do not use floor polish or wax that makes floors slippery. If you must use wax, use non-skid floor wax. ?Do not have throw rugs and other things on the floor that can make you trip. ?What can I do with my stairs? ?Do not leave any items on the stairs. ?Make sure that there are handrails on both sides of the stairs and use them. Fix handrails that are broken or loose. Make sure that handrails are as long as the stairways. ?Check any carpeting to make  sure that it is firmly attached to the stairs. Fix any carpet that is loose or worn. ?Avoid having throw rugs at the top or bottom of the stairs. If you do have throw rugs, attach them to the floor with carpet tape. ?Make sure that you have a light switch at the top of the stairs and the bottom of the stairs. If you do not have them, ask someone to add them for you. ?What else can I do to help prevent falls? ?Wear shoes that: ?Do not have high heels. ?Have rubber bottoms. ?Are comfortable and fit you well. ?Are closed at the toe. Do not wear sandals. ?If you use a stepladder: ?Make sure that it is fully opened. Do not climb a closed stepladder. ?Make sure that both sides of the stepladder are locked  into place. ?Ask someone to hold it for you, if possible. ?Clearly mark and make sure that you can see: ?Any grab bars or handrails. ?First and last steps. ?Where the edge of each step is. ?Use tools that help you m

## 2021-09-24 ENCOUNTER — Other Ambulatory Visit: Payer: Self-pay | Admitting: Family Medicine

## 2022-02-05 ENCOUNTER — Telehealth: Payer: Self-pay | Admitting: Family Medicine

## 2022-02-05 NOTE — Telephone Encounter (Signed)
Caller is Pauravi from [employer] [facility] division  Caller states: -Patient called to confirm DEXA scan appointment but found it was at the wrong location - Patient had previous scan completed at [facility] and would need this one completed at the same location rather than the breast center as scheduled   Caller requests: - PCP place a new DEXA scan order so she can reschedule patient

## 2022-02-06 ENCOUNTER — Other Ambulatory Visit: Payer: Self-pay | Admitting: *Deleted

## 2022-02-06 DIAGNOSIS — M8580 Other specified disorders of bone density and structure, unspecified site: Secondary | ICD-10-CM

## 2022-02-06 NOTE — Telephone Encounter (Signed)
Referral for  [facility] bone density placed

## 2022-02-11 ENCOUNTER — Ambulatory Visit
Admission: RE | Admit: 2022-02-11 | Discharge: 2022-02-11 | Disposition: A | Discharge status: Home / Self Care | Payer: Medicare PPO | Source: Ambulatory Visit | Attending: Family Medicine | Admitting: Family Medicine

## 2022-02-11 DIAGNOSIS — M810 Age-related osteoporosis without current pathological fracture: Secondary | ICD-10-CM | POA: Diagnosis not present

## 2022-02-11 DIAGNOSIS — Z780 Asymptomatic menopausal state: Secondary | ICD-10-CM | POA: Diagnosis not present

## 2022-02-11 DIAGNOSIS — M8580 Other specified disorders of bone density and structure, unspecified site: Secondary | ICD-10-CM

## 2022-02-14 NOTE — Progress Notes (Signed)
Please inform patient of the following:  Her xray shows that she has mild osteoporosis. Can we have her come in to the office to discuss treatment?  Stephanie Hunt. Jimmey Ralph, MD 02/14/2022 1:07 PM

## 2022-02-21 ENCOUNTER — Ambulatory Visit (INDEPENDENT_AMBULATORY_CARE_PROVIDER_SITE_OTHER): Payer: Medicare PPO | Admitting: Family Medicine

## 2022-02-21 ENCOUNTER — Other Ambulatory Visit (INDEPENDENT_AMBULATORY_CARE_PROVIDER_SITE_OTHER): Payer: Medicare PPO

## 2022-02-21 ENCOUNTER — Encounter: Payer: Self-pay | Admitting: Family Medicine

## 2022-02-21 VITALS — BP 118/78 | HR 60 | Temp 98.0°F | Ht 62.0 in | Wt 137.6 lb

## 2022-02-21 DIAGNOSIS — E785 Hyperlipidemia, unspecified: Secondary | ICD-10-CM | POA: Diagnosis not present

## 2022-02-21 DIAGNOSIS — R739 Hyperglycemia, unspecified: Secondary | ICD-10-CM

## 2022-02-21 DIAGNOSIS — J309 Allergic rhinitis, unspecified: Secondary | ICD-10-CM | POA: Diagnosis not present

## 2022-02-21 DIAGNOSIS — Z0001 Encounter for general adult medical examination with abnormal findings: Secondary | ICD-10-CM | POA: Diagnosis not present

## 2022-02-21 DIAGNOSIS — I10 Essential (primary) hypertension: Secondary | ICD-10-CM | POA: Diagnosis not present

## 2022-02-21 DIAGNOSIS — Z23 Encounter for immunization: Secondary | ICD-10-CM | POA: Diagnosis not present

## 2022-02-21 DIAGNOSIS — M810 Age-related osteoporosis without current pathological fracture: Secondary | ICD-10-CM | POA: Diagnosis not present

## 2022-02-21 LAB — CBC
HCT: 41.7 % (ref 36.0–46.0)
Hemoglobin: 13.6 g/dL (ref 12.0–15.0)
MCHC: 32.7 g/dL (ref 30.0–36.0)
MCV: 90.2 fl (ref 78.0–100.0)
Platelets: 231 10*3/uL (ref 150.0–400.0)
RBC: 4.62 Mil/uL (ref 3.87–5.11)
RDW: 14.5 % (ref 11.5–15.5)
WBC: 6.7 10*3/uL (ref 4.0–10.5)

## 2022-02-21 LAB — COMPREHENSIVE METABOLIC PANEL
ALT: 11 U/L (ref 0–35)
AST: 17 U/L (ref 0–37)
Albumin: 3.7 g/dL (ref 3.5–5.2)
Alkaline Phosphatase: 98 U/L (ref 39–117)
BUN: 13 mg/dL (ref 6–23)
CO2: 31 mEq/L (ref 19–32)
Calcium: 9.1 mg/dL (ref 8.4–10.5)
Chloride: 103 mEq/L (ref 96–112)
Creatinine, Ser: 0.97 mg/dL (ref 0.40–1.20)
GFR: 53.44 mL/min — ABNORMAL LOW (ref >60.00)
Glucose, Bld: 87 mg/dL (ref 70–99)
Potassium: 3.7 mEq/L (ref 3.5–5.1)
Sodium: 140 mEq/L (ref 135–145)
Total Bilirubin: 0.4 mg/dL (ref 0.2–1.2)
Total Protein: 6.9 g/dL (ref 6.0–8.3)

## 2022-02-21 LAB — LIPID PANEL
Cholesterol: 228 mg/dL — ABNORMAL HIGH (ref 0–200)
HDL: 61 mg/dL (ref >39.00)
LDL Cholesterol: 142 mg/dL — ABNORMAL HIGH (ref 0–99)
NonHDL: 167.02
Total CHOL/HDL Ratio: 4
Triglycerides: 123 mg/dL (ref 0.0–149.0)
VLDL: 24.6 mg/dL (ref 0.0–40.0)

## 2022-02-21 MED ORDER — ALENDRONATE SODIUM 70 MG PO TABS: 70.0000 mg | ORAL_TABLET | ORAL | 4 refills | Status: DC

## 2022-02-21 NOTE — Assessment & Plan Note (Signed)
Check lipids 

## 2022-02-21 NOTE — Patient Instructions (Signed)
It was very nice to see you today!  Please start the Fosamax to help with your bone strength.    We will give your flu shot today.  Keep an eye on your blood pressure let me know if it is persistently elevated.  We will check blood work today.  I will see back in 6 months.  Please come back to see me sooner if needed.  Take care, Dr Jerline Pain  PLEASE NOTE:  If you had any lab tests please let us know if you have not heard back within a few days. You may see your results on mychart before we have a chance to review them but we will give you a call once they are reviewed by [place]. If we ordered any referrals today, please let us know if you have not heard from their office within the next week.   Please try these tips to maintain a healthy lifestyle:  Eat at least 3 REAL meals and 1-2 snacks per day.  Aim for no more than 5 hours between eating.  If you eat breakfast, please do so within one hour of getting up.   Each meal should contain half fruits/vegetables, one quarter protein, and one quarter carbs (no bigger than a computer mouse)  Cut down on sweet beverages. This includes juice, soda, and sweet tea.   Drink at least 1 glass of water with each meal and aim for at least 8 glasses per day  Exercise at least 150 minutes every week.

## 2022-02-21 NOTE — Assessment & Plan Note (Signed)
Still has issues especially at night.  We discussed starting Astelin or Atrovent however she deferred.  She will try over-the-counter allergy meds.  Recommended Allegra.  She can also take low-dose Benadryl at night.  She will let me know if not improving.  We will consider trial of Astelin or Singulair if this continues to be an issue.

## 2022-02-21 NOTE — Assessment & Plan Note (Signed)
Recent T score -2.6.  We discussed treatment options.  She is already on calcium and vitamin D supplementation.  She gets plenty of weightbearing exercises. We will start Fosamax 70 mg weekly.  We discussed potential side effects.  We will repeat bone density scan in 2 years.

## 2022-02-21 NOTE — Assessment & Plan Note (Signed)
At goal on valsartan-HCTZ 160-25 once daily.

## 2022-02-21 NOTE — Progress Notes (Signed)
Chief Complaint:  Stephanie Hunt is a 86 y.o. female who presents today for her annual comprehensive physical exam.    Assessment/Plan:  Chronic Problems Addressed Today: Allergic rhinitis Still has issues especially at night.  We discussed starting Astelin or Atrovent however she deferred.  She will try over-the-counter allergy meds.  Recommended Allegra.  She can also take low-dose Benadryl at night.  She will let me know if not improving.  We will consider trial of Astelin or Singulair if this continues to be an issue.  Hyperlipidemia Check lipids.  Osteoporosis Recent T score -2.6.  We discussed treatment options.  She is already on calcium and vitamin D supplementation.  She gets plenty of weightbearing exercises. We will start Fosamax 70 mg weekly.  We discussed potential side effects.  We will repeat bone density scan in 2 years.  Hypertension At goal on valsartan-HCTZ 160-25 once daily.  Preventative Healthcare: Flu shot given today.  Check labs.  Patient Counseling(The following topics were reviewed and/or handout was given):  -Nutrition: Stressed importance of moderation in sodium/caffeine intake, saturated fat and cholesterol, caloric balance, sufficient intake of fresh fruits, vegetables, and fiber.  -Stressed the importance of regular exercise.   -Substance Abuse: Discussed cessation/primary prevention of tobacco, alcohol, or other drug use; driving or other dangerous activities under the influence; availability of treatment for abuse.   -Injury prevention: Discussed safety belts, safety helmets, smoke detector, smoking near bedding or upholstery.   -Sexuality: Discussed sexually transmitted diseases, partner selection, use of condoms, avoidance of unintended pregnancy and contraceptive alternatives.   -Dental health: Discussed importance of regular tooth brushing, flossing, and dental visits.  -Health maintenance and immunizations reviewed. Please refer to Health maintenance  section.  Return to care in 1 year for next preventative visit.     Subjective:  HPI:  She has no acute complaints today. See a/p for status of chronic conditions.       02/21/2022    8:20 AM  Depression screen PHQ 2/9  Decreased Interest 0  Down, Depressed, Hopeless 0  PHQ - 2 Score 0   ROS: Per HPI, otherwise a complete review of systems was negative.   PMH:  The following were reviewed and entered/updated in epic: Past Medical History:  Diagnosis Date   Hyperlipidemia    Hypertension    Patient Active Problem List   Diagnosis Date Noted   Allergic rhinitis 11/15/2020   Trochanteric bursitis of left hip 03/10/2020   Sciatica, left side 12/15/2019   Hypertension 04/15/2017   Hyperlipidemia 04/15/2017   Osteoporosis 04/15/2017   Past Surgical History:  Procedure Laterality Date   WRIST FRACTURE SURGERY Bilateral     Family History  Problem Relation Age of Onset   Cancer Mother        Uterus   Hypertension Father     Medications- reviewed and updated Current Outpatient Medications  Medication Sig Dispense Refill   alendronate (FOSAMAX) 70 MG tablet Take 1 tablet (70 mg total) by mouth every 7 (seven) days. Take with a full glass of water on an empty stomach. 12 tablet 4   AMBULATORY NON FORMULARY MEDICATION Walker with seat.  Disp 1.  Try using aerocare Sciatica M54.32 1 each 0   Calcium Carbonate-Vitamin D (CALTRATE 600+D PO) Take by mouth.     Multiple Vitamin (MULTIVITAMIN) tablet Take 1 tablet by mouth daily.     valsartan-hydrochlorothiazide (DIOVAN-HCT) 160-25 MG tablet TAKE 1 TABLET EVERY DAY 90 tablet 3   No current facility-administered  medications for this visit.    Allergies-reviewed and updated Allergies  Allergen Reactions   Penicillins Rash    Social History   Socioeconomic History   Marital status: Widowed    Spouse name: Not on file   Number of children: Not on file   Years of education: Not on file   Highest education level:  Not on file  Occupational History   Occupation: retired  Tobacco Use   Smoking status: Never   Smokeless tobacco: Never  Vaping Use   Vaping Use: Never used  Substance and Sexual Activity   Alcohol use: No   Drug use: No   Sexual activity: Never  Other Topics Concern   Not on file  Social History Narrative   Moved to area from [name] in 2018   Social Determinants of Health   Financial Resource Strain: Low Risk  (09/20/2021)   Overall Financial Resource Strain (CARDIA)    Difficulty of Paying Living Expenses: Not hard at all  Food Insecurity: No Food Insecurity (09/20/2021)   Hunger Vital Sign    Worried About Running Out of Food in the Last Year: Never true    Ran Out of Food in the Last Year: Never true  Transportation Needs: No Transportation Needs (09/20/2021)   PRAPARE - Administrator, Civil Service (Medical): No    Lack of Transportation (Non-Medical): No  Physical Activity: Sufficiently Active (09/20/2021)   Exercise Vital Sign    Days of Exercise per Week: 4 days    Minutes of Exercise per Session: 60 min  Stress: No Stress Concern Present (09/20/2021)   [employer] of Occupational Health - Occupational Stress Questionnaire    Feeling of Stress : Not at all  Social Connections: Moderately Isolated (09/20/2021)   Social Connection and Isolation Panel [NHANES]    Frequency of Communication with Friends and Family: More than three times a week    Frequency of Social Gatherings with Friends and Family: More than three times a week    Attends Religious Services: 1 to 4 times per year    Active Member of [employer] or Organizations: No    Attends Banker Meetings: Never    Marital Status: Widowed        Objective:  Physical Exam: BP 118/78   Pulse 60   Temp 98 F (36.7 C) (Temporal)   Ht 5\' 2"  (1.575 m)   Wt 137 lb 9.6 oz (62.4 kg)   LMP  (LMP Unknown)   SpO2 98%   BMI 25.17 kg/m   Body mass index is 25.17 kg/m. Wt Readings from Last 3  Encounters:  02/21/22 137 lb 9.6 oz (62.4 kg)  07/16/21 133 lb 6.4 oz (60.5 kg)  11/15/20 137 lb 3.2 oz (62.2 kg)   Gen: NAD, resting comfortably HEENT: TMs normal bilaterally. OP clear. No thyromegaly noted.  CV: RRR with no murmurs appreciated Pulm: NWOB, CTAB with no crackles, wheezes, or rhonchi GI: Normal bowel sounds present. Soft, Nontender, Nondistended. MSK: no edema, cyanosis, or clubbing noted Skin: warm, dry Neuro: CN2-12 grossly intact. Strength 5/5 in upper and lower extremities. Reflexes symmetric and intact bilaterally.  Psych: Normal affect and thought content     Ellee Wawrzyniak M. 11/17/20, MD 02/21/2022 9:17 AM

## 2022-02-22 LAB — TSH: TSH: 1.17 u[IU]/mL (ref 0.35–5.50)

## 2022-02-25 LAB — HEMOGLOBIN A1C: Hgb A1c MFr Bld: 6 % (ref 4.6–6.5)

## 2022-02-26 NOTE — Progress Notes (Signed)
Please inform patient of the following:  Good news! Labs are all stable. We can recheck in a year.  Stephanie Hunt. Jerline Pain, MD 02/26/2022 7:52 AM

## 2022-03-18 ENCOUNTER — Ambulatory Visit (INDEPENDENT_AMBULATORY_CARE_PROVIDER_SITE_OTHER)
Admission: RE | Admit: 2022-03-18 | Discharge: 2022-03-18 | Disposition: A | Discharge status: Home / Self Care | Payer: Medicare PPO | Source: Ambulatory Visit | Attending: Family Medicine | Admitting: Family Medicine

## 2022-03-18 ENCOUNTER — Ambulatory Visit (INDEPENDENT_AMBULATORY_CARE_PROVIDER_SITE_OTHER): Payer: Medicare PPO | Admitting: Family Medicine

## 2022-03-18 ENCOUNTER — Encounter: Payer: Self-pay | Admitting: Family Medicine

## 2022-03-18 VITALS — BP 164/86 | HR 61 | Temp 97.6°F | Ht 62.0 in | Wt 135.6 lb

## 2022-03-18 DIAGNOSIS — R058 Other specified cough: Secondary | ICD-10-CM

## 2022-03-18 DIAGNOSIS — J208 Acute bronchitis due to other specified organisms: Secondary | ICD-10-CM | POA: Diagnosis not present

## 2022-03-18 DIAGNOSIS — I10 Essential (primary) hypertension: Secondary | ICD-10-CM | POA: Diagnosis not present

## 2022-03-18 DIAGNOSIS — B9689 Other specified bacterial agents as the cause of diseases classified elsewhere: Secondary | ICD-10-CM

## 2022-03-18 DIAGNOSIS — J9811 Atelectasis: Secondary | ICD-10-CM | POA: Diagnosis not present

## 2022-03-18 DIAGNOSIS — R0789 Other chest pain: Secondary | ICD-10-CM | POA: Diagnosis not present

## 2022-03-18 DIAGNOSIS — R059 Cough, unspecified: Secondary | ICD-10-CM | POA: Diagnosis not present

## 2022-03-18 LAB — POC COVID19 BINAXNOW: SARS Coronavirus 2 Ag: NEGATIVE

## 2022-03-18 MED ORDER — ALBUTEROL SULFATE HFA 108 (90 BASE) MCG/ACT IN AERS: 2.0000 | INHALATION_SPRAY | RESPIRATORY_TRACT | 2 refills | Status: DC | PRN

## 2022-03-18 MED ORDER — GUAIFENESIN-CODEINE 100-10 MG/5ML PO SOLN: 5.0000 mL | Freq: Four times a day (QID) | ORAL | 0 refills | Status: DC | PRN

## 2022-03-18 MED ORDER — AZITHROMYCIN 250 MG PO TABS: ORAL_TABLET | ORAL | 0 refills | Status: DC

## 2022-03-18 NOTE — Patient Instructions (Signed)
Please follow up if symptoms do not improve or as needed.    You may use mucinex DM during the daytime for your cough along with the inhaler. See below for instructions. Complete the antibiotics and the cough syrup has codeine in it to help suppress your cough as well.   Please go to our [facility] office to get your xrays done. You can walk in M-F between 8:30am- noon or 1pm - 5pm. Tell them you are there for xrays ordered by me. They will send me the results, then I will let you know the results with instructions.   Address: [address].  The Xray department is located in the basement.   How to Use a Metered Dose Inhaler  A metered dose inhaler (MDI) is a handheld device filled with medicine that must be breathed into the lungs (inhaled). The medicine is delivered by pushing down on a metal canister. This releases a preset amount of spray and mist through the mouth and into the lungs. Each MDI canister holds a certain number of doses (puffs). Using a spacer with a metered dose inhaler may be recommended to help get more medicine into the lungs. A spacer is a plastic tube that connects to the MDI on one end and has a mouthpiece on the other end. A spacer holds the medicine in the tube for a short time. This allows more medicine to be inhaled. The MDI can be used to deliver many kinds of inhaled medicines, including: Quick relief or rescue medicines, such as bronchodilators. Controller medicines, such as corticosteroids. What are the risks? If you do not use your inhaler correctly, medicine might not reach your lungs to help you breathe. If you do not have enough strength to push down the canister to make it spray, ask your health care provider for ways to help. The medicine in the MDI may cause side effects, such as: Mouth sores (thrush). Cough. Hoarseness. Shakiness. Headache. Supplies needed: A metered dose inhaler. A spacer, if recommended. How to use a metered dose  inhaler without a spacer  Remove the cap from the inhaler. If you are using the inhaler for the first time, shake it for 5 seconds, turn it away from your face, then release 4 puffs into the air. This is called priming. Shake the inhaler for 5 seconds. Position the inhaler so the top of the canister faces up. Put your index finger on the top of the medicine canister. Support the bottom of the inhaler with your thumb. Breathe out normally and as completely as possible, away from the inhaler. Either place the inhaler between your teeth and close your lips tightly around the mouthpiece, or hold the inhaler 1-2 inches (2.5-5 cm) away from your open mouth. Keep your tongue down out of the way. If you are unsure which technique to use, ask your health care provider. Press the canister down with your index finger to release the medicine. Inhale deeply and slowly through your mouth until your lungs are completely filled. Do not breathe in through your nose. Inhaling should take 4-6 seconds. Hold the medicine in your lungs for 5-10 seconds (10 seconds is best). This helps the medicine get into the small airways of your lungs. Remove the inhaler from your mouth, turn your head, and breathe out normally. Wait about 1 minute between puffs or as directed. Then repeat steps 3-10 until you have taken the number of puffs that your health care provider directed. Put  the cap on the inhaler. If you are using a steroid inhaler, rinse your mouth with water, gargle, and spit out the water. Do not swallow the water. How to use a metered dose inhaler with a spacer  Remove the cap from the inhaler. If you are using the inhaler for the first time, shake it for 5 seconds, turn it away from your face, then release 4 puffs into the air. This is called priming. Shake the inhaler for 5 seconds. Place the open end of the spacer onto the inhaler mouthpiece. Position the inhaler so the top of the canister faces up and the spacer  mouthpiece faces you. Put your index finger on the top of the medicine canister. Support the bottom of the inhaler and the spacer with your thumb. Breathe out normally and as completely as possible, away from the spacer. Place the spacer between your teeth and close your lips tightly around it. Keep your tongue down out of the way. Press the canister down with your index finger to release the medicine, then inhale deeply and slowly through your mouth until your lungs are completely filled. Do not breathe in through your nose. Inhaling should take 4-6 seconds. Hold the medicine in your lungs for 5-10 seconds (10 seconds is best). This helps the medicine get into the small airways of your lungs. Remove the spacer from your mouth, turn your head, and breathe out normally. Wait about 1 minute between puffs or as directed. Then repeat steps 3-11 until you have taken the number of puffs that your health care provider directed. Remove the spacer from the inhaler and put the cap on the inhaler. If you are using a steroid inhaler, rinse your mouth with water, gargle, and spit out the water. Do not swallow the water. Follow these instructions at home: Caring for your MDI Store your inhaler at or near room temperature. A cold MDI will not work properly. Follow directions on the package insert for care and cleaning of your MDI and spacer. General instructions Take your inhaled medicine only as told by your health care provider. Do not use the inhaler more than directed by your health care provider. Refill your MDI with medicine before all the preset doses have been used. If your inhaler has a counter, check it to determine how full your MDI is. The number you see tells you how many doses are left. If your inhaler does not have a counter, ask your health care provider when you will need to refill it. Then write the refill date on a calendar or on your MDI canister. Keep in mind that you cannot tell when the  medicine in an inhaler is empty by shaking it. You may feel or hear something in the canister even when the preset medicine doses have been used up. Keeping track of your dosages is important. Do not use any products that contain nicotine or tobacco, such as cigarettes, e-cigarettes, and chewing tobacco. If you need help quitting, ask your health care provider. Keep all follow-up visits as told by your health care provider. This is important. Where to find more information Centers for Disease Control and Prevention: http://www.wolf.info/ American Lung Association: www.lung.org Contact a health care provider if: Symptoms are only partially relieved with your inhaler. You are having trouble using your inhaler. You have side effects from the medicine. You have chills or a fever. You have night sweats. There is blood in your thick saliva (phlegm). Get help right away if: You have  dizziness. You have a fast heart rate. You have severe shortness of breath. You have difficulty breathing. These symptoms may represent a serious problem that is an emergency. Do not wait to see if the symptoms will go away. Get medical help right away. Call your local emergency services (911 in the [place] Do not drive yourself to the hospital. Summary A metered dose inhaler is a handheld device for taking medicine that must be breathed into the lungs (inhaled). Take your inhaled medicine only as told by your health care provider. Do not use the inhaler more than directed by your health care provider. You cannot tell when the medicine is gone in an inhaler by shaking it. Refill it with medicine before all the preset doses have been used. Follow directions on the package insert for care and cleaning of your MDI and spacer. This information is not intended to replace advice given to you by your health care provider. Make sure you discuss any questions you have with your health care provider. Document Revised: 07/06/2019 Document  Reviewed: 07/06/2019 Elsevier Patient Education  2023 [employer].

## 2022-03-18 NOTE — Progress Notes (Signed)
Subjective  CC:  Chief Complaint  Patient presents with   Cough    Pt has been coughing/chest congestion for the past week.    HPI: SUBJECTIVE:  Same day acute visit; PCP not available. New pt to me. Chart reviewed.   Stephanie Hunt is a 87 y.o. female who complains of feeling poorly for the last 2 weeks.  She reports that she got her COVID and flu shot vaccinations at the end of September.  After that she became achy and flulike.  Since, she has a progressively worsening cough.  It is harsh, barking and productive.  She is having significant cough spasms triggered by inhalation.  She denies dyspnea on exertion or pleuritic chest pain.  She denies palpitations.  She denies fevers.  However she did have a sore throat and congestion.  She has a moderate to severe headache that is persistent.  She has not tested for COVID and has not had any known exposures.  She is vaccinated.  She has no lung disease.  She denies GI symptoms, abdominal pain.  Appetite is down but she is drinking fluids.  She has hypertension which has been well controlled.   Assessment  1. Acute bacterial bronchitis   2. Spasmodic cough   3. Hypertension, unspecified type      Plan  Discussion:  Treat for bacterial bronchitis due to prolonged course and worsening symptoms.  COVID test was negative in the office today.  Her coughing is harsh and difficult to control.  She reports she does not tolerate prednisone.  I suspect bronchospasm is involved.  Educated on how to use an inhaler, cover with Z-Pak and Robitussin-AC.  She will follow-up here if not improving.  I recommend a chest x-ray.  Education regarding differences between viral and bacterial infections and treatment options are discussed.  Supportive care measures are recommended.  We discussed the use of mucolytic's, decongestants, antihistamines and antitussives as needed.  Tylenol or Advil are recommended if needed.  Hypertension: Hypertensive response to coughing  spasms today.  Has been well controlled.  No medication changes.  Will monitor  Follow up: If not improving.  Orders Placed This Encounter  Procedures   DG Chest 2 View   POC COVID-19   Meds ordered this encounter  Medications   azithromycin (ZITHROMAX) 250 MG tablet    Sig: Take 2 tabs today, then 1 tab daily for 4 days    Dispense:  1 each    Refill:  0   albuterol (VENTOLIN HFA) 108 (90 Base) MCG/ACT inhaler    Sig: Inhale 2 puffs into the lungs every 4 (four) hours as needed for wheezing or shortness of breath.    Dispense:  1 each    Refill:  2   guaiFENesin-codeine 100-10 MG/5ML syrup    Sig: Take 5 mLs by mouth every 6 (six) hours as needed for cough.    Dispense:  120 mL    Refill:  0      I reviewed the patients updated PMH, FH, and SocHx.  Social History: Patient  reports that she has never smoked. She has never used smokeless tobacco. She reports that she does not drink alcohol and does not use drugs.  Patient Active Problem List   Diagnosis Date Noted   Allergic rhinitis 11/15/2020   Trochanteric bursitis of left hip 03/10/2020   Sciatica, left side 12/15/2019   Hypertension 04/15/2017   Hyperlipidemia 04/15/2017   Osteoporosis 04/15/2017    Review of Systems:  Cardiovascular: negative for chest pain Respiratory: negative for SOB or hemoptysis Gastrointestinal: negative for abdominal pain Genitourinary: negative for dysuria or gross hematuria Current Meds  Medication Sig   albuterol (VENTOLIN HFA) 108 (90 Base) MCG/ACT inhaler Inhale 2 puffs into the lungs every 4 (four) hours as needed for wheezing or shortness of breath.   alendronate (FOSAMAX) 70 MG tablet Take 1 tablet (70 mg total) by mouth every 7 (seven) days. Take with a full glass of water on an empty stomach.   AMBULATORY NON FORMULARY MEDICATION Walker with seat.  Disp 1.  Try using aerocare Sciatica M54.32   azithromycin (ZITHROMAX) 250 MG tablet Take 2 tabs today, then 1 tab daily for 4 days    Calcium Carbonate-Vitamin D (CALTRATE 600+D PO) Take by mouth.   guaiFENesin-codeine 100-10 MG/5ML syrup Take 5 mLs by mouth every 6 (six) hours as needed for cough.   Multiple Vitamin (MULTIVITAMIN) tablet Take 1 tablet by mouth daily.   valsartan-hydrochlorothiazide (DIOVAN-HCT) 160-25 MG tablet TAKE 1 TABLET EVERY DAY    Objective  Vitals: BP (!) 164/86   Pulse 61   Temp 97.6 F (36.4 C)   Ht 5\' 2"  (1.575 m)   Wt 135 lb 9.6 oz (61.5 kg)   LMP  (LMP Unknown)   SpO2 95%   BMI 24.80 kg/m  General: no acute distress but significant harsh coughing spells throughout the visit. Psych:  Alert and oriented, normal mood and affect HEENT:  Normocephalic, atraumatic, supple neck, moist mucous membranes, mildly erythematous pharynx without exudate, mild lymphadenopathy, supple neck Cardiovascular:  RRR without murmur. no edema Respiratory: Difficult to get a deep breath in, little air movement but no obvious wheezing rales or rhonchi present.  Harsh coughing spasms Skin:  Warm, no rashes Neurologic:   Mental status is normal. normal gait  Office Visit on 03/18/2022  Component Date Value Ref Range Status   SARS Coronavirus 2 Ag 03/18/2022 Negative  Negative Final    Commons side effects, risks, benefits, and alternatives for medications and treatment plan prescribed today were discussed, and the patient expressed understanding of the given instructions. Patient is instructed to call or message via MyChart if he/she has any questions or concerns regarding our treatment plan. No barriers to understanding were identified. We discussed Red Flag symptoms and signs in detail. Patient expressed understanding regarding what to do in case of urgent or emergency type symptoms.  Medication list was reconciled, printed and provided to the patient in AVS. Patient instructions and summary information was reviewed with the patient as documented in the AVS. This note was prepared with assistance of Dragon  voice recognition software. Occasional wrong-word or sound-a-like substitutions may have occurred due to the inherent limitations of voice recognition software

## 2022-03-20 ENCOUNTER — Telehealth: Payer: Self-pay | Admitting: Family Medicine

## 2022-03-20 NOTE — Telephone Encounter (Signed)
Date of birth verified by patient  Xray  results given, Per Dr Jonni Sanger There is no pneumonia or worrisome findings here Pt verbalized understanding

## 2022-03-20 NOTE — Telephone Encounter (Signed)
Patient states she does not use MyChart and requests to be called at ph# [phone] to be given her chest XRAY results. Sending to both PCP team and ordering team.

## 2022-07-03 ENCOUNTER — Telehealth: Payer: Self-pay | Admitting: Family Medicine

## 2022-07-03 NOTE — Telephone Encounter (Signed)
  LAST APPOINTMENT DATE:  02/21/22  NEXT APPOINTMENT DATE:  MEDICATION:  valsartan-hydrochlorothiazide (DIOVAN-HCT) 160-25 MG tablet   AND  alendronate (FOSAMAX) 70 MG tablet   Is the patient out of medication? No-approx. 2 weeks  PHARMACY: [facility], Fax# [phone] (Patient states RX's must be either Faxed to the Fax# listed above or call Ph# [phone]  Let patient know to contact pharmacy at the end of the day to make sure medication is ready.  Please notify patient to allow 48-72 hours to process

## 2022-07-04 ENCOUNTER — Other Ambulatory Visit: Payer: Self-pay | Admitting: *Deleted

## 2022-07-04 MED ORDER — ALENDRONATE SODIUM 70 MG PO TABS: 70.0000 mg | ORAL_TABLET | ORAL | 1 refills | Status: DC

## 2022-07-04 MED ORDER — VALSARTAN-HYDROCHLOROTHIAZIDE 160-25 MG PO TABS: 1.0000 | ORAL_TABLET | Freq: Every day | ORAL | 0 refills | Status: DC

## 2022-07-04 NOTE — Telephone Encounter (Signed)
Rx send to pharmacy  

## 2022-07-26 IMAGING — MR MR LUMBAR SPINE W/O CM
4 of 5 series · 24 of 48 positions shown · non-contrast
Comparison: Lumbar radiographs 12/01/2019

CLINICAL DATA: Low back pain with progressive neurologic deficit.
Left-sided sciatica.

EXAM:
MRI LUMBAR SPINE WITHOUT CONTRAST
TECHNIQUE: Multiplanar, multisequence MR imaging of the lumbar spine was
performed. No intravenous contrast was administered.

[Series 4: T2 · sagittal · 4.0mm · 0.81mm/px · 6 of 17 slices shown (1 of 2)]
[im 1/17]
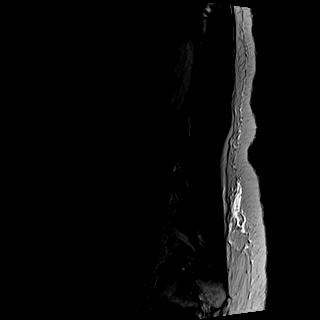
[im 4/17]
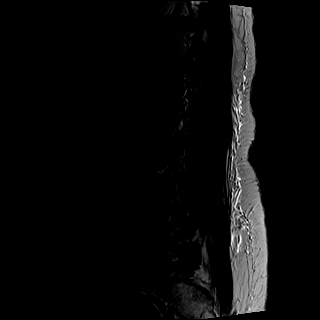
[im 7/17]
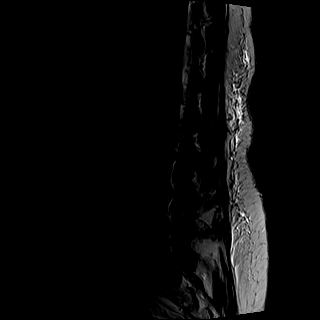
[im 10/17]
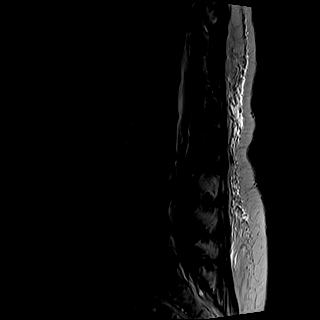
[im 13/17]
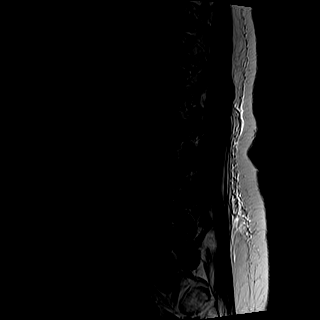
[im 17/17]
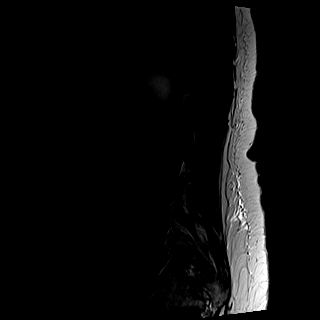

[Series 5: T1 · sagittal · 4.0mm · 0.41mm/px · 7 of 17 slices shown (1 of 2)]
[im 1/17]
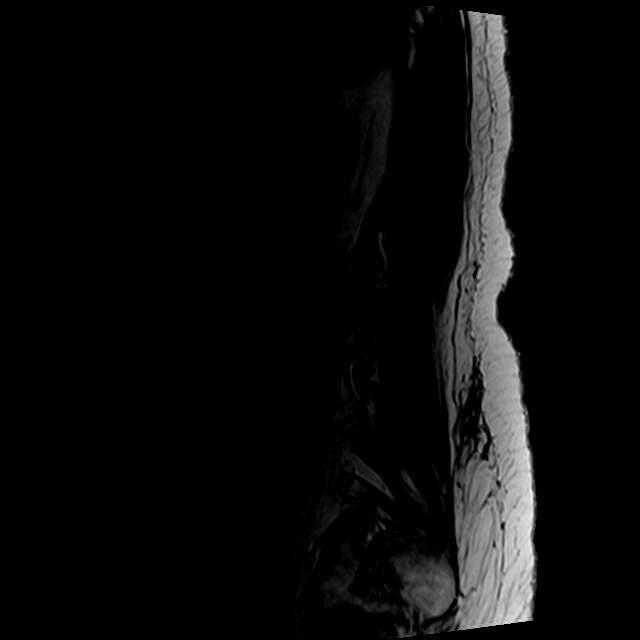
[im 3/17]
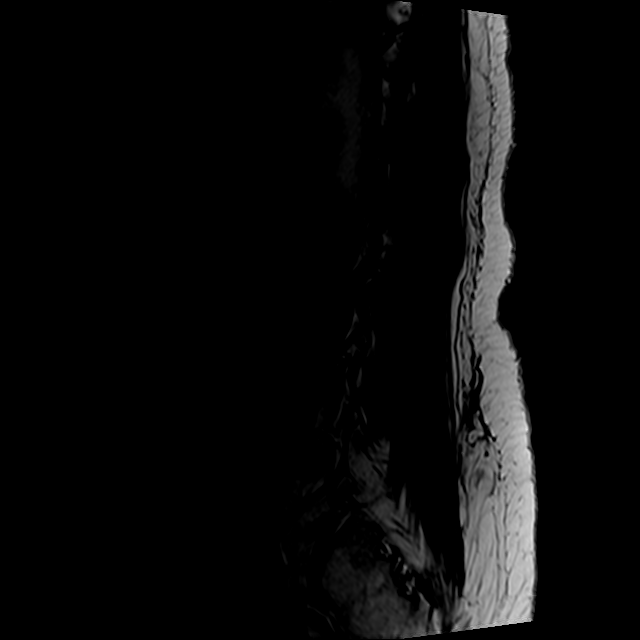
[im 6/17]
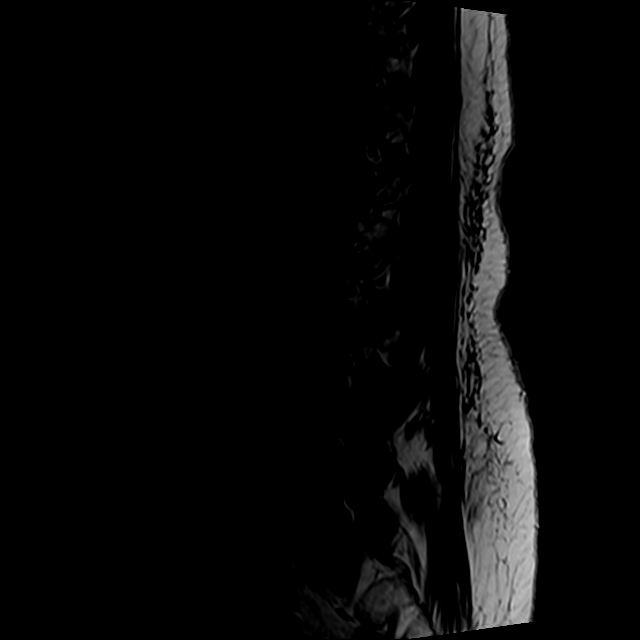
[im 9/17]
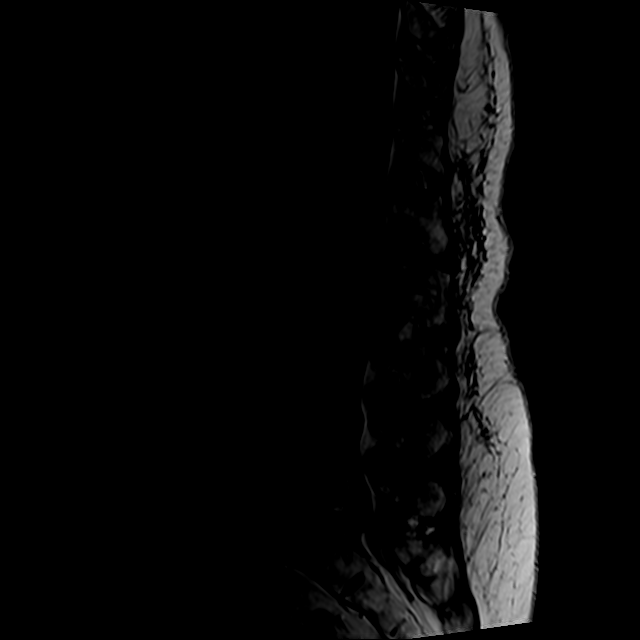
[im 11/17]
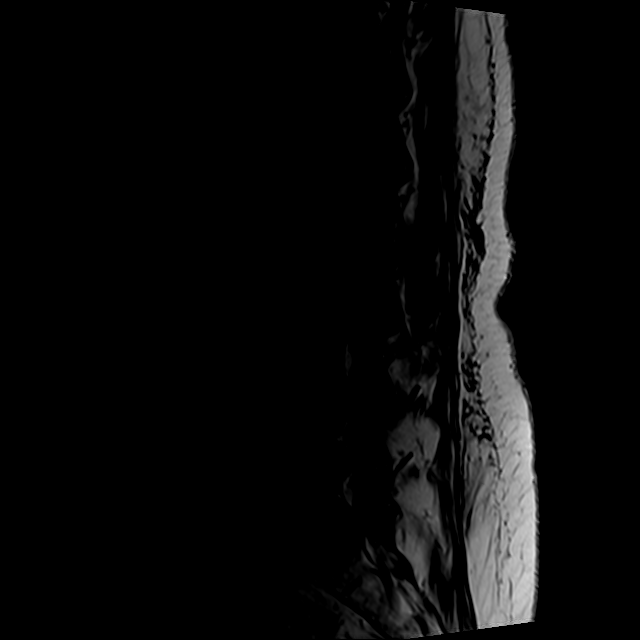
[im 14/17]
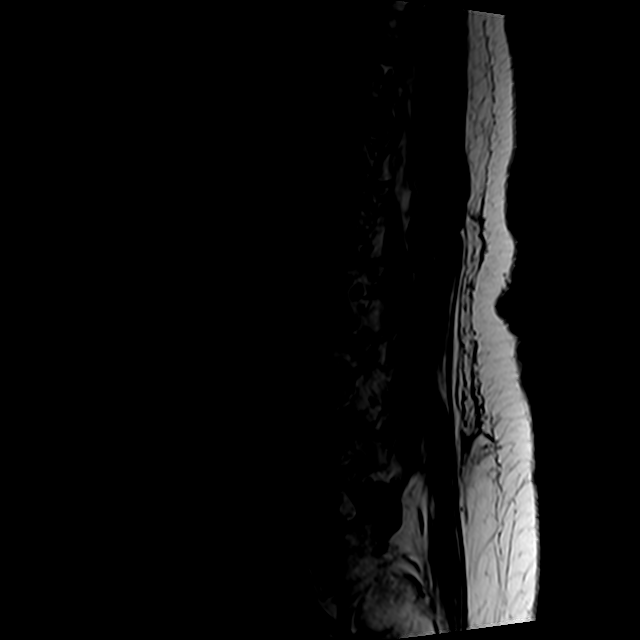
[im 17/17]
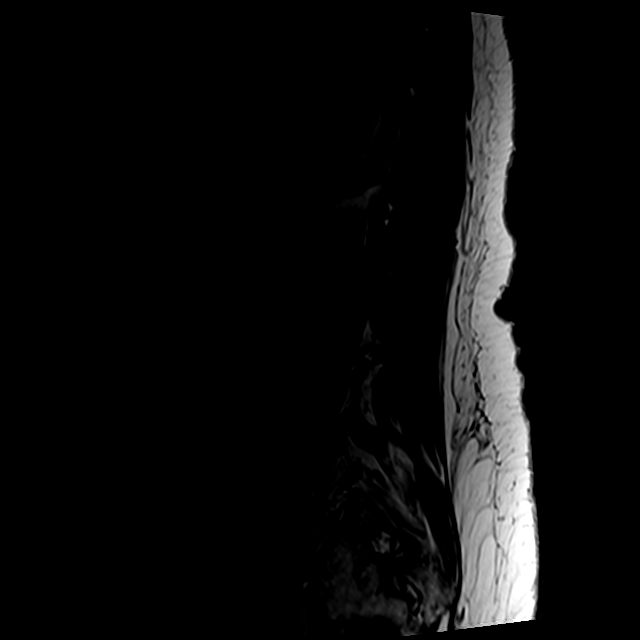

[Series 6: T2 · axial · 4.0mm · 0.78mm/px · z∈[-99,+96]mm · 8 of 35 slices shown (2 of 2)]
[im 1/35]
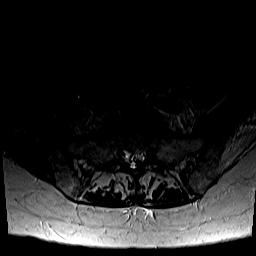
[im 6/35]
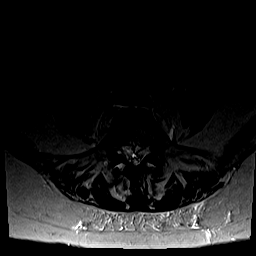
[im 11/35]
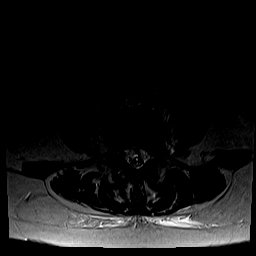
[im 16/35]
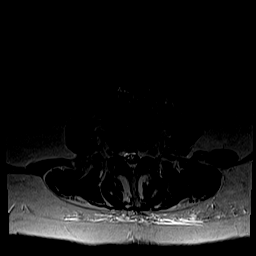
[im 19/35]
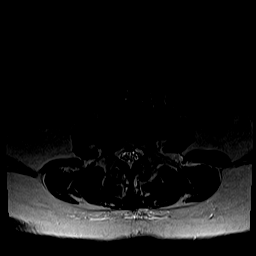
[im 24/35]
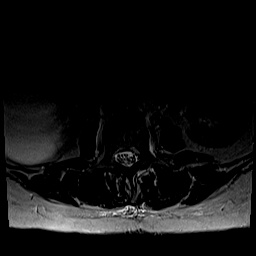
[im 29/35]
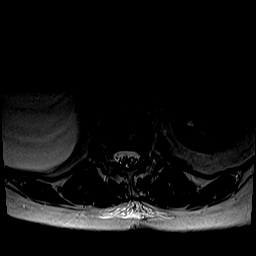
[im 35/35]
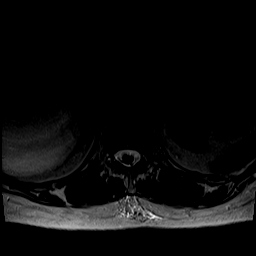

[Series 8: T1 · axial · 4.0mm · 0.39mm/px · z∈[-75,+66]mm · 3 of 35 slices shown (2 of 2)]
[im 6/35]
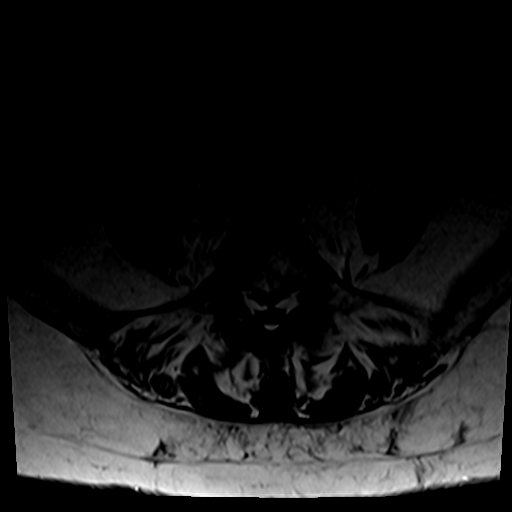
[im 19/35]
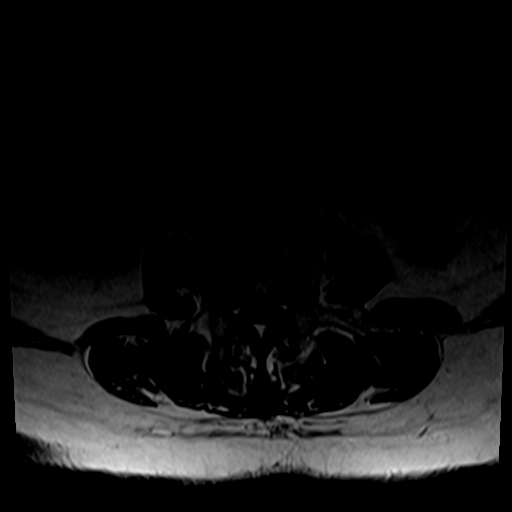
[im 29/35]
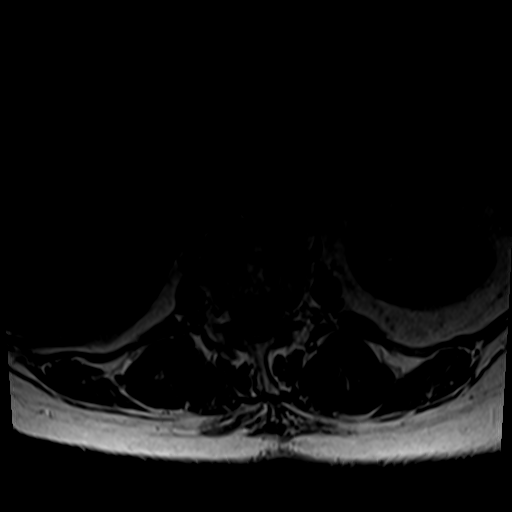

[24 of 48 positions shown; findings below may reference images not displayed]

FINDINGS: Segmentation:  Normal.  Lowest disc space L5-S1

Alignment:  Normal sagittal alignment.

Vertebrae:  Normal bone marrow.  Negative for fracture or mass.

Conus medullaris and cauda equina: Conus extends to the L1-2 level.
Conus and cauda equina appear normal.

Paraspinal and other soft tissues: Large right renal cyst. No
retroperitoneal mass or adenopathy.

Disc levels:

T12-L1: Mild degenerative change.  Negative for stenosis

L1-2: Mild disc and facet degeneration.  Negative for stenosis

L2-3: Moderate disc degeneration with disc space narrowing,
Schmorl's nodes, and spurring asymmetric to the left. Bilateral
facet hypertrophy. Mild spinal stenosis. Mild to moderate
subarticular stenosis on the left.

L3-4: Disc degeneration with diffuse disc bulging, Schmorl's nodes,
and endplate spurring. Bilateral facet hypertrophy. Moderate spinal
stenosis with moderate subarticular stenosis bilaterally.

L4-5: Disc degeneration with disc bulging and endplate spurring.
Moderate facet hypertrophy bilaterally. Moderate spinal stenosis and
moderate subarticular and foraminal stenosis bilaterally.

L5-S1: Disc degeneration with Schmorl's node. Left-sided disc
protrusion with severe subarticular stenosis on the left. Left S1
nerve root impingement due to disc protrusion, facet hypertrophy and
possible small synovial cyst. Moderate spinal stenosis.
IMPRESSION: Multilevel degenerative change in the lumbar spine. Multilevel
spinal and subarticular stenosis as above

Severe subarticular stenosis on the left at L5-S1 with left S1 nerve
root impingement due to disc protrusion, facet hypertrophy and
possible small synovial cyst.

## 2022-08-06 ENCOUNTER — Encounter: Payer: Self-pay | Admitting: Family Medicine

## 2022-08-06 ENCOUNTER — Ambulatory Visit (INDEPENDENT_AMBULATORY_CARE_PROVIDER_SITE_OTHER): Payer: No Typology Code available for payment source | Admitting: Family Medicine

## 2022-08-06 VITALS — BP 135/79 | HR 54 | Temp 98.0°F | Ht 62.0 in | Wt 132.0 lb

## 2022-08-06 DIAGNOSIS — M810 Age-related osteoporosis without current pathological fracture: Secondary | ICD-10-CM | POA: Diagnosis not present

## 2022-08-06 DIAGNOSIS — I10 Essential (primary) hypertension: Secondary | ICD-10-CM | POA: Diagnosis not present

## 2022-08-06 DIAGNOSIS — M5432 Sciatica, left side: Secondary | ICD-10-CM

## 2022-08-06 DIAGNOSIS — J309 Allergic rhinitis, unspecified: Secondary | ICD-10-CM | POA: Diagnosis not present

## 2022-08-06 MED ORDER — ALENDRONATE SODIUM 70 MG PO TABS: 70.0000 mg | ORAL_TABLET | ORAL | 3 refills | Status: DC

## 2022-08-06 MED ORDER — VITAMIN D3 25 MCG (1000 UT) PO CAPS: 1000.0000 [IU] | ORAL_CAPSULE | Freq: Every day | ORAL | 3 refills | Status: DC

## 2022-08-06 MED ORDER — VALSARTAN-HYDROCHLOROTHIAZIDE 160-25 MG PO TABS: 1.0000 | ORAL_TABLET | Freq: Every day | ORAL | 3 refills | Status: DC

## 2022-08-06 NOTE — Assessment & Plan Note (Signed)
Blood pressure at goal on valsartan/HCTZ 160-25 once daily.  Will refill meds today.

## 2022-08-06 NOTE — Assessment & Plan Note (Signed)
Patient with mild flare of some radicular symptoms in left leg.  Not currently have any symptoms.  Reassuring exam today.  No red flags.  Reassured patient this is not indicative of any underlying neuropathy but is likely coming from her back.  We did discuss having her follow back up with sports medicine however she deferred for now.  We discussed reasons to return to care.

## 2022-08-06 NOTE — Progress Notes (Signed)
   Stephanie Hunt is a 88 y.o. female who presents today for an office visit.  Assessment/Plan:  Chronic Problems Addressed Today: Hypertension Blood pressure at goal on valsartan/HCTZ 160-25 once daily.  Will refill meds today.  Osteoporosis She is doing well on Fosamax 70 mg weekly.  Will refill today.  Due for bone density scan in 2025.  We discussed vitamin D and calcium supplementation.  Allergic rhinitis Flared up recently.  She can use over-the-counter meds as needed.  Sciatica, left side Patient with mild flare of some radicular symptoms in left leg.  Not currently have any symptoms.  Reassuring exam today.  No red flags.  Reassured patient this is not indicative of any underlying neuropathy but is likely coming from her back.  We did discuss having her follow back up with sports medicine however she deferred for now.  We discussed reasons to return to care.      Subjective:  HPI:  See A/p for status of chronic conditions.  She has been having some numbness in her left leg.  This started a couple of months ago. Comes and goes. Sometimes painful. Nothing makes it better or worse. Does not feel like sciatica.        Objective:  Physical Exam: BP 135/79   Pulse (!) 54   Temp 98 F (36.7 C) (Temporal)   Ht '5\' 2"'$  (1.575 m)   Wt 132 lb (59.9 kg)   LMP  (LMP Unknown)   SpO2 98%   BMI 24.14 kg/m   Gen: No acute distress, resting comfortably CV: Regular rate and rhythm with no murmurs appreciated Pulm: Normal work of breathing, clear to auscultation bilaterally with no crackles, wheezes, or rhonchi MSK:Leg: No deformities.  Sensation light touch intact throughout.  Normal strength.  Neurovascular intact distally Neuro: Grossly normal, moves all extremities Psych: Normal affect and thought content      Jennifer Holland M. Jerline Pain, MD 08/06/2022 11:30 AM

## 2022-08-06 NOTE — Patient Instructions (Signed)
It was very nice to see you today!  I am glad you are doing well.  We will refill your medications today.  You probably having a slight pinched nerve in your back.  Please let us know if this worsens.  Please take vitamin D 1000IU daily.  We we will see you back in 6 months.  Come back sooner if needed.  Take care, Dr Jerline Pain  PLEASE NOTE:  If you had any lab tests, please let us know if you have not heard back within a few days. You may see your results on mychart before we have a chance to review them but we will give you a call once they are reviewed by [place].   If we ordered any referrals today, please let us know if you have not heard from their office within the next week.   If you had any urgent prescriptions sent in today, please check with the pharmacy within an hour of our visit to make sure the prescription was transmitted appropriately.   Please try these tips to maintain a healthy lifestyle:  Eat at least 3 REAL meals and 1-2 snacks per day.  Aim for no more than 5 hours between eating.  If you eat breakfast, please do so within one hour of getting up.   Each meal should contain half fruits/vegetables, one quarter protein, and one quarter carbs (no bigger than a computer mouse)  Cut down on sweet beverages. This includes juice, soda, and sweet tea.   Drink at least 1 glass of water with each meal and aim for at least 8 glasses per day  Exercise at least 150 minutes every week.

## 2022-08-06 NOTE — Assessment & Plan Note (Signed)
She is doing well on Fosamax 70 mg weekly.  Will refill today.  Due for bone density scan in 2025.  We discussed vitamin D and calcium supplementation.

## 2022-08-06 NOTE — Assessment & Plan Note (Signed)
Flared up recently.  She can use over-the-counter meds as needed.

## 2022-10-03 ENCOUNTER — Ambulatory Visit: Payer: No Typology Code available for payment source

## 2022-10-03 VITALS — Wt 132.0 lb

## 2022-10-03 DIAGNOSIS — Z0000 Encounter for general adult medical examination without abnormal findings: Secondary | ICD-10-CM

## 2022-10-03 NOTE — Patient Instructions (Signed)
Stephanie Hunt , Thank you for taking time to come for your Medicare Wellness Visit. I appreciate your ongoing commitment to your health goals. Please review the following plan we discussed and let me know if I can assist you in the future.   These are the goals we discussed:  Goals      Patient Stated     Take Saturday to do something "normal" that you enjoy.      Patient Stated     Get back to walking better     Patient Stated     Keep active      Patient Stated     Continue to walk daily      PharmD Care Plan     CARE PLAN ENTRY (see longitudinal plan of care for additional care plan information)  Current Barriers:  Chronic Disease Management support, education, and care coordination needs related to Hypertension, Hyperlipidemia, and Osteopenia   Hypertension BP Readings from Last 3 Encounters:  01/13/20 130/88  12/15/19 (!) 157/84  12/01/19 130/90  Pharmacist Clinical Goal(s): Over the next 180 days, patient will work with PharmD and providers to maintain BP goal <150/90 Current regimen:  Valsartan-HCTZ 160-25 mg once daily Interventions: Home BP monitoring Patient self care activities - Over the next 180 days, patient will: Check BP at least once every one to weeks, document, and provide at future appointments Ensure daily salt intake < 2300 mg/day  Hyperlipidemia Lab Results  Component Value Date/Time   LDLCALC 149 (H) 03/17/2019 09:58 AM  Pharmacist Clinical Goal(s): Over the next 180 days, patient will work with PharmD and providers to achieve LDL goal < 130 Current regimen:  No current medication, previously on statin Interventions: Diet  Patient self care activities - Over the next 130 days, patient will: Continue current management  Osteopenia Pharmacist Clinical Goal(s) Over the next 180 days, patient will work with PharmD and providers to minimize bone thinning through exercise as tolerated and appropriate vitamin D and calcium intake/supplementation   Current regimen:  [employer]  Interventions: Weight bearing exercises as tolerated Patient self care activities - Over the next 180 days, patient will: Continue current management  Medication management Pharmacist Clinical Goal(s): Over the next 180 days, patient will work with PharmD and providers to maintain optimal medication adherence Current pharmacy: [facility] Interventions Comprehensive medication review performed. Utilize [facility] pharmacy for medication synchronization, packaging and delivery Patient self care activities - Over the next 180 days, patient will: Take medications as prescribed Report any questions or concerns to PharmD and/or provider(s)  Initial goal documentation.     Track and Manage My Blood Pressure-Hypertension     Timeframe:  Long-Range Goal Priority:  High Start Date:  08/06/21                           Expected End Date:   02/06/22                    Follow Up Date 11/06/21    - check blood pressure weekly - choose a place to take my blood pressure (home, clinic or office, retail store)    Why is this important?   You won't feel high blood pressure, but it can still hurt your blood vessels.  High blood pressure can cause heart or kidney problems. It can also cause a stroke.  Making lifestyle changes like losing a little weight or eating less salt will help.  Checking your  blood pressure at home and at different times of the day can help to control blood pressure.  If the doctor prescribes medicine remember to take it the way the doctor ordered.  Call the office if you cannot afford the medicine or if there are questions about it.     Notes:         This is a list of the screening recommended for you and due dates:  Health Maintenance  Topic Date Due   DTaP/Tdap/Td vaccine (1 - Tdap) Never done   COVID-19 Vaccine (4 - 2023-24 season) 02/01/2022   Flu Shot  01/02/2023   Medicare Annual Wellness Visit  10/03/2023   DEXA scan (bone density  measurement)  02/12/2024   Pneumonia Vaccine  Completed   Zoster (Shingles) Vaccine  Completed   HPV Vaccine  Aged Out    Advanced directives: Please bring a copy of your health care power of attorney and living will to the office at your convenience.  Conditions/risks identified: continue to walk   Next appointment: Follow up in one year for your annual wellness visit    Preventive Care 65 Years and Older, Female Preventive care refers to lifestyle choices and visits with your health care provider that can promote health and wellness. What does preventive care include? A yearly physical exam. This is also called an annual well check. Dental exams once or twice a year. Routine eye exams. Ask your health care provider how often you should have your eyes checked. Personal lifestyle choices, including: Daily care of your teeth and gums. Regular physical activity. Eating a healthy diet. Avoiding tobacco and drug use. Limiting alcohol use. Practicing safe sex. Taking low-dose aspirin every day. Taking vitamin and mineral supplements as recommended by your health care provider. What happens during an annual well check? The services and screenings done by your health care provider during your annual well check will depend on your age, overall health, lifestyle risk factors, and family history of disease. Counseling  Your health care provider may ask you questions about your: Alcohol use. Tobacco use. Drug use. Emotional well-being. Home and relationship well-being. Sexual activity. Eating habits. History of falls. Memory and ability to understand (cognition). Work and work Astronomer. Reproductive health. Screening  You may have the following tests or measurements: Height, weight, and BMI. Blood pressure. Lipid and cholesterol levels. These may be checked every 5 years, or more frequently if you are over 39 years old. Skin check. Lung cancer screening. You may have this  screening every year starting at age 50 if you have a 30-pack-year history of smoking and currently smoke or have quit within the past 15 years. Fecal occult blood test (FOBT) of the stool. You may have this test every year starting at age 88. Flexible sigmoidoscopy or colonoscopy. You may have a sigmoidoscopy every 5 years or a colonoscopy every 10 years starting at age 33. Hepatitis C blood test. Hepatitis B blood test. Sexually transmitted disease (STD) testing. Diabetes screening. This is done by checking your blood sugar (glucose) after you have not eaten for a while (fasting). You may have this done every 1-3 years. Bone density scan. This is done to screen for osteoporosis. You may have this done starting at age 68. Mammogram. This may be done every 1-2 years. Talk to your health care provider about how often you should have regular mammograms. Talk with your health care provider about your test results, treatment options, and if necessary, the need for more tests.  Vaccines  Your health care provider may recommend certain vaccines, such as: Influenza vaccine. This is recommended every year. Tetanus, diphtheria, and acellular pertussis (Tdap, Td) vaccine. You may need a Td booster every 10 years. Zoster vaccine. You may need this after age 24. Pneumococcal 13-valent conjugate (PCV13) vaccine. One dose is recommended after age 65. Pneumococcal polysaccharide (PPSV23) vaccine. One dose is recommended after age 32. Talk to your health care provider about which screenings and vaccines you need and how often you need them. This information is not intended to replace advice given to you by your health care provider. Make sure you discuss any questions you have with your health care provider. Document Released: 06/16/2015 Document Revised: 02/07/2016 Document Reviewed: 03/21/2015 Elsevier Interactive Patient Education  2017 [employer].  Fall Prevention in the Home Falls can cause injuries.  They can happen to people of all ages. There are many things you can do to make your home safe and to help prevent falls. What can I do on the outside of my home? Regularly fix the edges of walkways and driveways and fix any cracks. Remove anything that might make you trip as you walk through a door, such as a raised step or threshold. Trim any bushes or trees on the path to your home. Use bright outdoor lighting. Clear any walking paths of anything that might make someone trip, such as rocks or tools. Regularly check to see if handrails are loose or broken. Make sure that both sides of any steps have handrails. Any raised decks and porches should have guardrails on the edges. Have any leaves, snow, or ice cleared regularly. Use sand or salt on walking paths during winter. Clean up any spills in your garage right away. This includes oil or grease spills. What can I do in the bathroom? Use night lights. Install grab bars by the toilet and in the tub and shower. Do not use towel bars as grab bars. Use non-skid mats or decals in the tub or shower. If you need to sit down in the shower, use a plastic, non-slip stool. Keep the floor dry. Clean up any water that spills on the floor as soon as it happens. Remove soap buildup in the tub or shower regularly. Attach bath mats securely with double-sided non-slip rug tape. Do not have throw rugs and other things on the floor that can make you trip. What can I do in the bedroom? Use night lights. Make sure that you have a light by your bed that is easy to reach. Do not use any sheets or blankets that are too big for your bed. They should not hang down onto the floor. Have a firm chair that has side arms. You can use this for support while you get dressed. Do not have throw rugs and other things on the floor that can make you trip. What can I do in the kitchen? Clean up any spills right away. Avoid walking on wet floors. Keep items that you use a lot  in easy-to-reach places. If you need to reach something above you, use a strong step stool that has a grab bar. Keep electrical cords out of the way. Do not use floor polish or wax that makes floors slippery. If you must use wax, use non-skid floor wax. Do not have throw rugs and other things on the floor that can make you trip. What can I do with my stairs? Do not leave any items on the stairs. Make sure that  there are handrails on both sides of the stairs and use them. Fix handrails that are broken or loose. Make sure that handrails are as long as the stairways. Check any carpeting to make sure that it is firmly attached to the stairs. Fix any carpet that is loose or worn. Avoid having throw rugs at the top or bottom of the stairs. If you do have throw rugs, attach them to the floor with carpet tape. Make sure that you have a light switch at the top of the stairs and the bottom of the stairs. If you do not have them, ask someone to add them for you. What else can I do to help prevent falls? Wear shoes that: Do not have high heels. Have rubber bottoms. Are comfortable and fit you well. Are closed at the toe. Do not wear sandals. If you use a stepladder: Make sure that it is fully opened. Do not climb a closed stepladder. Make sure that both sides of the stepladder are locked into place. Ask someone to hold it for you, if possible. Clearly mark and make sure that you can see: Any grab bars or handrails. First and last steps. Where the edge of each step is. Use tools that help you move around (mobility aids) if they are needed. These include: Canes. Walkers. Scooters. Crutches. Turn on the lights when you go into a dark area. Replace any light bulbs as soon as they burn out. Set up your furniture so you have a clear path. Avoid moving your furniture around. If any of your floors are uneven, fix them. If there are any pets around you, be aware of where they are. Review your medicines  with your doctor. Some medicines can make you feel dizzy. This can increase your chance of falling. Ask your doctor what other things that you can do to help prevent falls. This information is not intended to replace advice given to you by your health care provider. Make sure you discuss any questions you have with your health care provider. Document Released: 03/16/2009 Document Revised: 10/26/2015 Document Reviewed: 06/24/2014 Elsevier Interactive Patient Education  2017 [employer].

## 2022-10-03 NOTE — Progress Notes (Signed)
I connected with  Paulita Licklider on 10/03/22 by a audio enabled telemedicine application and verified that I am speaking with the correct person using two identifiers.  Patient Location: Home  Provider Location: Office/Clinic  I discussed the limitations of evaluation and management by telemedicine. The patient expressed understanding and agreed to proceed.   Subjective:   Janece Laidlaw is a 88 y.o. female who presents for Medicare Annual (Subsequent) preventive examination.  Review of Systems     Cardiac Risk Factors include: advanced age (>43men, >29 women);dyslipidemia;hypertension     Objective:    Today's Vitals   10/03/22 1331  Weight: 132 lb (59.9 kg)   Body mass index is 24.14 kg/m.     10/03/2022    1:36 PM 09/20/2021    1:32 PM 04/20/2020    9:40 AM 03/17/2019   10:24 AM 08/21/2017   10:59 AM  Advanced Directives  Does Patient Have a Medical Advance Directive? Yes Yes Yes Yes Yes  Type of Estate agent of [place];Living will Living will Healthcare Power of [place];Living will Healthcare Power of [place];Living will   Does patient want to make changes to medical advance directive?    No - Patient declined   Copy of Healthcare Power of Attorney in Chart? No - copy requested  No - copy requested No - copy requested     Current Medications (verified) Outpatient Encounter Medications as of 10/03/2022  Medication Sig   alendronate (FOSAMAX) 70 MG tablet Take 1 tablet (70 mg total) by mouth every 7 (seven) days. Take with a full glass of water on an empty stomach.   AMBULATORY NON FORMULARY MEDICATION Walker with seat.  Disp 1.  Try using aerocare Sciatica M54.32   Calcium Carbonate-Vitamin D (CALTRATE 600+D PO) Take by mouth.   Cholecalciferol (VITAMIN D3) 25 MCG (1000 UT) capsule Take 1 capsule (1,000 Units total) by mouth daily.   Multiple Vitamin (MULTIVITAMIN) tablet Take 1 tablet by mouth daily.   valsartan-hydrochlorothiazide (DIOVAN-HCT)  160-25 MG tablet Take 1 tablet by mouth daily.   albuterol (VENTOLIN HFA) 108 (90 Base) MCG/ACT inhaler Inhale 2 puffs into the lungs every 4 (four) hours as needed for wheezing or shortness of breath. (Patient not taking: Reported on 10/03/2022)   No facility-administered encounter medications on file as of 10/03/2022.    Allergies (verified) Penicillins   History: Past Medical History:  Diagnosis Date   Hyperlipidemia    Hypertension    Past Surgical History:  Procedure Laterality Date   WRIST FRACTURE SURGERY Bilateral    Family History  Problem Relation Age of Onset   Cancer Mother        Uterus   Hypertension Father    Social History   Socioeconomic History   Marital status: Widowed    Spouse name: Not on file   Number of children: Not on file   Years of education: Not on file   Highest education level: Not on file  Occupational History   Occupation: retired  Tobacco Use   Smoking status: Never   Smokeless tobacco: Never  Vaping Use   Vaping Use: Never used  Substance and Sexual Activity   Alcohol use: No   Drug use: No   Sexual activity: Never  Other Topics Concern   Not on file  Social History Narrative   Moved to area from [name] in 2018   Social Determinants of Health   Financial Resource Strain: Low Risk  (10/03/2022)   Overall Financial Resource Strain (CARDIA)  Difficulty of Paying Living Expenses: Not hard at all  Food Insecurity: No Food Insecurity (10/03/2022)   Hunger Vital Sign    Worried About Running Out of Food in the Last Year: Never true    Ran Out of Food in the Last Year: Never true  Transportation Needs: No Transportation Needs (10/03/2022)   PRAPARE - Administrator, Civil Service (Medical): No    Lack of Transportation (Non-Medical): No  Physical Activity: Sufficiently Active (10/03/2022)   Exercise Vital Sign    Days of Exercise per Week: 5 days    Minutes of Exercise per Session: 30 min  Stress: No Stress Concern Present  (10/03/2022)   [employer] of Occupational Health - Occupational Stress Questionnaire    Feeling of Stress : Not at all  Social Connections: Moderately Isolated (10/03/2022)   Social Connection and Isolation Panel [NHANES]    Frequency of Communication with Friends and Family: More than three times a week    Frequency of Social Gatherings with Friends and Family: More than three times a week    Attends Religious Services: More than 4 times per year    Active Member of [employer] or Organizations: No    Attends Banker Meetings: Never    Marital Status: Widowed    Tobacco Counseling Counseling given: Not Answered   Clinical Intake:  Pre-visit preparation completed: Yes  Pain : No/denies pain     BMI - recorded: 24.14 Nutritional Status: BMI of 19-24  Normal Nutritional Risks: None Diabetes: No  How often do you need to have someone help you when you read instructions, pamphlets, or other written materials from your doctor or pharmacy?: 1 - Never  Diabetic?no  Interpreter Needed?: No  Information entered by :: Lanier Ensign, LPN   Activities of Daily Living    10/03/2022    1:36 PM  In your present state of health, do you have any difficulty performing the following activities:  Hearing? 0  Vision? 0  Difficulty concentrating or making decisions? 0  Walking or climbing stairs? 0  Dressing or bathing? 0  Doing errands, shopping? 0  Preparing Food and eating ? N  Using the Toilet? N  In the past six months, have you accidently leaked urine? N  Do you have problems with loss of bowel control? N  Managing your Medications? N  Managing your Finances? N  Housekeeping or managing your Housekeeping? N    Patient Care Team: Ardith Dark, MD as PCP - General (Family Medicine) Shea Evans Genice Rouge as Consulting Physician (Optometry) Erroll Luna, [facility] (Pharmacist)  Indicate any recent Medical Services you may have received from other than [facility] providers in  the past year (date may be approximate).     Assessment:   This is a routine wellness examination for Brihany.  Hearing/Vision screen Hearing Screening - Comments:: Pt denies any hearing issues  Vision Screening - Comments:: Pt follows up with Dr Shea Evans for annual eye exams   Dietary issues and exercise activities discussed: Current Exercise Habits: Home exercise routine, Type of exercise: walking, Time (Minutes): 30, Frequency (Times/Week): 5, Weekly Exercise (Minutes/Week): 150   Goals Addressed             This Visit's Progress    Patient Stated       Continue to walk daily        Depression Screen    10/03/2022    1:34 PM 08/06/2022   10:51 AM 03/18/2022  11:07 AM 02/21/2022    8:20 AM 09/20/2021    1:32 PM 11/15/2020    8:25 AM 04/20/2020    9:38 AM  PHQ 2/9 Scores  PHQ - 2 Score 0 0 0 0 0 0 0  PHQ- 9 Score      0     Fall Risk    10/03/2022    1:36 PM 08/06/2022   10:51 AM 03/18/2022   11:07 AM 02/21/2022    8:20 AM 09/20/2021    1:33 PM  Fall Risk   Falls in the past year? 0 0 0 0 0  Number falls in past yr: 0 0 0 0 0  Injury with Fall? 0 0 0 0 0  Risk for fall due to : Impaired vision No Fall Risks No Fall Risks No Fall Risks Impaired vision  Follow up Falls prevention discussed  Falls evaluation completed  Falls prevention discussed    FALL RISK PREVENTION PERTAINING TO THE HOME:  Any stairs in or around the home? Yes  If so, are there any without handrails? No  Home free of loose throw rugs in walkways, pet beds, electrical cords, etc? Yes  Adequate lighting in your home to reduce risk of falls? Yes   ASSISTIVE DEVICES UTILIZED TO PREVENT FALLS:  Life alert? No  Use of a cane, walker or w/c? No  Grab bars in the bathroom? No  Shower chair or bench in shower? No  Elevated toilet seat or a handicapped toilet? No   TIMED UP AND GO:  Was the test performed? No .   Cognitive Function:        10/03/2022    1:37 PM 09/20/2021    1:36 PM 04/20/2020     9:43 AM 03/17/2019   10:25 AM  6CIT Screen  What Year? 0 points 0 points 0 points 0 points  What month? 0 points 0 points 0 points 0 points  What time? 0 points 0 points  0 points  Count back from 20 0 points 0 points 0 points 0 points  Months in reverse 0 points 0 points 0 points 0 points  Repeat phrase 0 points 0 points 0 points 0 points  Total Score 0 points 0 points  0 points    Immunizations Immunization History  Administered Date(s) Administered   Fluad Quad(high Dose 65+) 03/17/2019, 03/22/2020, 02/21/2022   Influenza, High Dose Seasonal PF 03/24/2018   Influenza-Unspecified 02/21/2021   PFIZER(Purple Top)SARS-COV-2 Vaccination 07/10/2019, 07/31/2019, 04/12/2020   PNEUMOCOCCAL CONJUGATE-20 11/15/2020   Zoster Recombinat (Shingrix) 03/17/2019, 07/02/2019    TDAP status: Due, Education has been provided regarding the importance of this vaccine. Advised may receive this vaccine at local pharmacy or Health Dept. Aware to provide a copy of the vaccination record if obtained from local pharmacy or Health Dept. Verbalized acceptance and understanding.  Flu Vaccine status: Up to date  Pneumococcal vaccine status: Up to date  Covid-19 vaccine status: Completed vaccines  Qualifies for Shingles Vaccine? Yes   Zostavax completed Yes   Shingrix Completed?: Yes  Screening Tests Health Maintenance  Topic Date Due   DTaP/Tdap/Td (1 - Tdap) Never done   COVID-19 Vaccine (4 - 2023-24 season) 02/01/2022   INFLUENZA VACCINE  01/02/2023   Medicare Annual Wellness (AWV)  10/03/2023   DEXA SCAN  02/12/2024   Pneumonia Vaccine 40+ Years old  Completed   Zoster Vaccines- Shingrix  Completed   HPV VACCINES  Aged Out    Health Maintenance  Health Maintenance Due  Topic Date Due   DTaP/Tdap/Td (1 - Tdap) Never done   COVID-19 Vaccine (4 - 2023-24 season) 02/01/2022    Colorectal cancer screening: No longer required.   Mammogram status: No longer required due to age .  Bone  Density status: Completed 02/11/22. Results reflect: Bone density results: OSTEOPOROSIS. Repeat every 2 years.  Additional Screening:  Vision Screening: Recommended annual ophthalmology exams for early detection of glaucoma and other disorders of the eye. Is the patient up to date with their annual eye exam?  Yes  Who is the provider or what is the name of the office in which the patient attends annual eye exams? Dr Shea Evans If pt is not established with a provider, would they like to be referred to a provider to establish care? No .   Dental Screening: Recommended annual dental exams for proper oral hygiene  Community Resource Referral / Chronic Care Management: CRR required this visit?  No   CCM required this visit?  No      Plan:     I have personally reviewed and noted the following in the patient's chart:   Medical and social history Use of alcohol, tobacco or illicit drugs  Current medications and supplements including opioid prescriptions. Patient is not currently taking opioid prescriptions. Functional ability and status Nutritional status Physical activity Advanced directives List of other physicians Hospitalizations, surgeries, and ER visits in previous 12 months Vitals Screenings to include cognitive, depression, and falls Referrals and appointments  In addition, I have reviewed and discussed with patient certain preventive protocols, quality metrics, and best practice recommendations. A written personalized care plan for preventive services as well as general preventive health recommendations were provided to patient.     Marzella Schlein, LPN   06/08/1094   Nurse Notes: none

## 2023-01-13 NOTE — Progress Notes (Unsigned)
    Aleen Sells [facility] [address] [place] [address] Phone: [phone]   Assessment and Plan:     There are no diagnoses linked to this encounter.  ***   Pertinent previous records reviewed include ***   Follow Up: ***     Subjective:   I, Sajad Glander, am serving as a Neurosurgeon for Doctor Richardean Sale  Chief Complaint: left foot numbness   HPI:   01/14/2023 Patient is a 88 year old female complaining of left foot numbness. Patient states  Relevant Historical Information: ***  Additional pertinent review of systems negative.   Current Outpatient Medications:    albuterol (VENTOLIN HFA) 108 (90 Base) MCG/ACT inhaler, Inhale 2 puffs into the lungs every 4 (four) hours as needed for wheezing or shortness of breath. (Patient not taking: Reported on 10/03/2022), Disp: 1 each, Rfl: 2   alendronate (FOSAMAX) 70 MG tablet, Take 1 tablet (70 mg total) by mouth every 7 (seven) days. Take with a full glass of water on an empty stomach., Disp: 12 tablet, Rfl: 3   AMBULATORY NON FORMULARY MEDICATION, Walker with seat.  Disp 1.  Try using aerocare Sciatica M54.32, Disp: 1 each, Rfl: 0   Calcium Carbonate-Vitamin D (CALTRATE 600+D PO), Take by mouth., Disp: , Rfl:    Cholecalciferol (VITAMIN D3) 25 MCG (1000 UT) capsule, Take 1 capsule (1,000 Units total) by mouth daily., Disp: 90 capsule, Rfl: 3   Multiple Vitamin (MULTIVITAMIN) tablet, Take 1 tablet by mouth daily., Disp: , Rfl:    valsartan-hydrochlorothiazide (DIOVAN-HCT) 160-25 MG tablet, Take 1 tablet by mouth daily., Disp: 90 tablet, Rfl: 3   Objective:     There were no vitals filed for this visit.    There is no height or weight on file to calculate BMI.    Physical Exam:    ***   Electronically signed by:  Aleen Sells [facility] 12:33 PM 01/13/23

## 2023-01-14 ENCOUNTER — Ambulatory Visit (INDEPENDENT_AMBULATORY_CARE_PROVIDER_SITE_OTHER): Payer: No Typology Code available for payment source | Admitting: Sports Medicine

## 2023-01-14 VITALS — BP 134/80 | Ht 62.0 in | Wt 128.0 lb

## 2023-01-14 DIAGNOSIS — M48062 Spinal stenosis, lumbar region with neurogenic claudication: Secondary | ICD-10-CM

## 2023-01-14 DIAGNOSIS — M5432 Sciatica, left side: Secondary | ICD-10-CM | POA: Diagnosis not present

## 2023-01-14 DIAGNOSIS — M5136 Other intervertebral disc degeneration, lumbar region: Secondary | ICD-10-CM | POA: Diagnosis not present

## 2023-01-14 NOTE — Patient Instructions (Signed)
Epidural left L5-S1  Follow up 2 weeks after to discuss results

## 2023-01-20 ENCOUNTER — Encounter: Payer: Self-pay | Admitting: Sports Medicine

## 2023-01-21 ENCOUNTER — Ambulatory Visit: Payer: No Typology Code available for payment source | Admitting: Family Medicine

## 2023-01-21 ENCOUNTER — Ambulatory Visit
Admission: RE | Admit: 2023-01-21 | Discharge: 2023-01-21 | Disposition: A | Discharge status: Home / Self Care | Payer: No Typology Code available for payment source | Source: Ambulatory Visit | Attending: Sports Medicine | Admitting: Sports Medicine

## 2023-01-21 DIAGNOSIS — M47817 Spondylosis without myelopathy or radiculopathy, lumbosacral region: Secondary | ICD-10-CM | POA: Diagnosis not present

## 2023-01-21 DIAGNOSIS — M5432 Sciatica, left side: Secondary | ICD-10-CM | POA: Diagnosis not present

## 2023-01-21 DIAGNOSIS — M5136 Other intervertebral disc degeneration, lumbar region: Secondary | ICD-10-CM

## 2023-01-21 DIAGNOSIS — M48062 Spinal stenosis, lumbar region with neurogenic claudication: Secondary | ICD-10-CM

## 2023-01-21 MED ORDER — METHYLPREDNISOLONE ACETATE 40 MG/ML INJ SUSP (RADIOLOG
80.0000 mg | Freq: Once | INTRAMUSCULAR | Status: AC
  Administered 2023-01-21: 80 mg via EPIDURAL

## 2023-01-21 MED ORDER — IOPAMIDOL (ISOVUE-M 200) INJECTION 41%
1.0000 mL | Freq: Once | INTRAMUSCULAR | Status: AC
  Administered 2023-01-21: 1 mL via EPIDURAL

## 2023-01-21 NOTE — Discharge Instructions (Signed)

## 2023-01-31 NOTE — Progress Notes (Unsigned)
    Aleen Sells [facility] [address] [place] [address] Phone: [phone]   Assessment and Plan:     There are no diagnoses linked to this encounter.  ***   Pertinent previous records reviewed include ***   Follow Up: ***     Subjective:   I, Taran Haynesworth, am serving as a Neurosurgeon for Doctor Richardean Sale   Chief Complaint: left foot numbness    HPI:    01/14/2023 Patient is a 88 year old female complaining of left foot numbness. Patient states that she feels like her foot goes to sleep that is now starting to go to her right foot. Thinks it could be coming from her low back. Foot numbness for sometime that has been getting worse over the last month. Ibu for the numbness and pain that seems to help. Numbness and pain radiates up to her calf now. She does do her chair exercises and walks   02/04/2023 Patient states   Relevant Historical Information: Hypertension, osteoporosis  Additional pertinent review of systems negative.   Current Outpatient Medications:    albuterol (VENTOLIN HFA) 108 (90 Base) MCG/ACT inhaler, Inhale 2 puffs into the lungs every 4 (four) hours as needed for wheezing or shortness of breath., Disp: 1 each, Rfl: 2   alendronate (FOSAMAX) 70 MG tablet, Take 1 tablet (70 mg total) by mouth every 7 (seven) days. Take with a full glass of water on an empty stomach., Disp: 12 tablet, Rfl: 3   AMBULATORY NON FORMULARY MEDICATION, Walker with seat.  Disp 1.  Try using aerocare Sciatica M54.32, Disp: 1 each, Rfl: 0   Calcium Carbonate-Vitamin D (CALTRATE 600+D PO), Take by mouth., Disp: , Rfl:    Cholecalciferol (VITAMIN D3) 25 MCG (1000 UT) capsule, Take 1 capsule (1,000 Units total) by mouth daily., Disp: 90 capsule, Rfl: 3   Multiple Vitamin (MULTIVITAMIN) tablet, Take 1 tablet by mouth daily., Disp: , Rfl:    valsartan-hydrochlorothiazide (DIOVAN-HCT) 160-25 MG tablet, Take 1 tablet by mouth daily., Disp: 90  tablet, Rfl: 3   Objective:     There were no vitals filed for this visit.    There is no height or weight on file to calculate BMI.    Physical Exam:    ***   Electronically signed by:  Aleen Sells [facility] 7:05 AM 01/31/23

## 2023-02-04 ENCOUNTER — Ambulatory Visit (INDEPENDENT_AMBULATORY_CARE_PROVIDER_SITE_OTHER): Payer: No Typology Code available for payment source | Admitting: Sports Medicine

## 2023-02-04 VITALS — BP 118/78 | HR 89 | Ht 62.0 in | Wt 132.0 lb

## 2023-02-04 DIAGNOSIS — M48062 Spinal stenosis, lumbar region with neurogenic claudication: Secondary | ICD-10-CM

## 2023-02-04 DIAGNOSIS — M5136 Other intervertebral disc degeneration, lumbar region: Secondary | ICD-10-CM | POA: Diagnosis not present

## 2023-02-04 DIAGNOSIS — M5432 Sciatica, left side: Secondary | ICD-10-CM

## 2023-02-06 ENCOUNTER — Ambulatory Visit: Payer: No Typology Code available for payment source | Admitting: Sports Medicine

## 2023-04-01 ENCOUNTER — Ambulatory Visit (INDEPENDENT_AMBULATORY_CARE_PROVIDER_SITE_OTHER): Payer: No Typology Code available for payment source

## 2023-04-01 DIAGNOSIS — Z23 Encounter for immunization: Secondary | ICD-10-CM | POA: Diagnosis not present

## 2023-06-20 ENCOUNTER — Encounter: Payer: Self-pay | Admitting: Family Medicine

## 2023-06-20 ENCOUNTER — Ambulatory Visit (INDEPENDENT_AMBULATORY_CARE_PROVIDER_SITE_OTHER): Payer: Medicare PPO | Admitting: Family Medicine

## 2023-06-20 VITALS — BP 135/84 | HR 64 | Temp 97.1°F | Ht 62.0 in | Wt 129.6 lb

## 2023-06-20 DIAGNOSIS — M5432 Sciatica, left side: Secondary | ICD-10-CM

## 2023-06-20 DIAGNOSIS — M810 Age-related osteoporosis without current pathological fracture: Secondary | ICD-10-CM

## 2023-06-20 DIAGNOSIS — I10 Essential (primary) hypertension: Secondary | ICD-10-CM

## 2023-06-20 DIAGNOSIS — E785 Hyperlipidemia, unspecified: Secondary | ICD-10-CM | POA: Diagnosis not present

## 2023-06-20 DIAGNOSIS — Z0001 Encounter for general adult medical examination with abnormal findings: Secondary | ICD-10-CM

## 2023-06-20 DIAGNOSIS — J309 Allergic rhinitis, unspecified: Secondary | ICD-10-CM

## 2023-06-20 DIAGNOSIS — R739 Hyperglycemia, unspecified: Secondary | ICD-10-CM

## 2023-06-20 LAB — COMPREHENSIVE METABOLIC PANEL
ALT: 14 U/L (ref 0–35)
AST: 19 U/L (ref 0–37)
Albumin: 4.3 g/dL (ref 3.5–5.2)
Alkaline Phosphatase: 88 U/L (ref 39–117)
BUN: 14 mg/dL (ref 6–23)
CO2: 30 meq/L (ref 19–32)
Calcium: 9.6 mg/dL (ref 8.4–10.5)
Chloride: 101 meq/L (ref 96–112)
Creatinine, Ser: 1 mg/dL (ref 0.40–1.20)
GFR: 51.05 mL/min — ABNORMAL LOW (ref >60.00)
Glucose, Bld: 93 mg/dL (ref 70–99)
Potassium: 4.3 meq/L (ref 3.5–5.1)
Sodium: 141 meq/L (ref 135–145)
Total Bilirubin: 0.6 mg/dL (ref 0.2–1.2)
Total Protein: 7.2 g/dL (ref 6.0–8.3)

## 2023-06-20 LAB — LIPID PANEL
Cholesterol: 260 mg/dL — ABNORMAL HIGH (ref 0–200)
HDL: 75.6 mg/dL (ref >39.00)
LDL Cholesterol: 168 mg/dL — ABNORMAL HIGH (ref 0–99)
NonHDL: 183.94
Total CHOL/HDL Ratio: 3
Triglycerides: 79 mg/dL (ref 0.0–149.0)
VLDL: 15.8 mg/dL (ref 0.0–40.0)

## 2023-06-20 LAB — CBC
HCT: 43.9 % (ref 36.0–46.0)
Hemoglobin: 14.5 g/dL (ref 12.0–15.0)
MCHC: 33.1 g/dL (ref 30.0–36.0)
MCV: 90.2 fL (ref 78.0–100.0)
Platelets: 252 10*3/uL (ref 150.0–400.0)
RBC: 4.87 Mil/uL (ref 3.87–5.11)
RDW: 14.6 % (ref 11.5–15.5)
WBC: 6.2 10*3/uL (ref 4.0–10.5)

## 2023-06-20 LAB — VITAMIN B12: Vitamin B-12: 363 pg/mL (ref 211–911)

## 2023-06-20 LAB — HEMOGLOBIN A1C: Hgb A1c MFr Bld: 6.1 % (ref 4.6–6.5)

## 2023-06-20 LAB — VITAMIN D 25 HYDROXY (VIT D DEFICIENCY, FRACTURES): VITD: 37.69 ng/mL (ref 30.00–100.00)

## 2023-06-20 LAB — TSH: TSH: 1.88 u[IU]/mL (ref 0.35–5.50)

## 2023-06-20 MED ORDER — VALSARTAN-HYDROCHLOROTHIAZIDE 160-25 MG PO TABS: 1.0000 | ORAL_TABLET | Freq: Every day | ORAL | 3 refills | Status: DC

## 2023-06-20 NOTE — Assessment & Plan Note (Signed)
Continue management per sports medicine.  Her last epidural steroid injection did not work well.  She will follow-up with them soon to discuss next steps in management.  She is having a little more paresthesias in her feet and would like to have labs checked to make sure there are no potential metabolic causes.  Will check labs including CBC, c-Met, TSH, A1c, and B12.

## 2023-06-20 NOTE — Progress Notes (Signed)
Chief Complaint:  Stephanie Hunt is a 89 y.o. female who presents today for her annual comprehensive physical exam.    Assessment/Plan:  Chronic Problems Addressed Today: Hypertension Blood pressure at goal on valsartan HCTZ 160-25 once daily.  Check labs.  Hyperlipidemia Check lipids.  Not on any meds.  Osteoporosis She is doing well with Fosamax 70 mg daily.  Due for repeat DEXA later this year.  Sciatica, left side Continue management per sports medicine.  Her last epidural steroid injection did not work well.  She will follow-up with them soon to discuss next steps in management.  She is having a little more paresthesias in her feet and would like to have labs checked to make sure there are no potential metabolic causes.  Will check labs including CBC, c-Met, TSH, A1c, and B12.   Preventative Healthcare: Check labs.  Due for DEXA later this year - will order today. UpToDate on vaccines.   Patient Counseling(The following topics were reviewed and/or handout was given):  -Nutrition: Stressed importance of moderation in sodium/caffeine intake, saturated fat and cholesterol, caloric balance, sufficient intake of fresh fruits, vegetables, and fiber.  -Stressed the importance of regular exercise.   -Substance Abuse: Discussed cessation/primary prevention of tobacco, alcohol, or other drug use; driving or other dangerous activities under the influence; availability of treatment for abuse.   -Injury prevention: Discussed safety belts, safety helmets, smoke detector, smoking near bedding or upholstery.   -Sexuality: Discussed sexually transmitted diseases, partner selection, use of condoms, avoidance of unintended pregnancy and contraceptive alternatives.   -Dental health: Discussed importance of regular tooth brushing, flossing, and dental visits.  -Health maintenance and immunizations reviewed. Please refer to Health maintenance section.  Return to care in 1 year for next preventative  visit.     Subjective:  HPI:  She has no acute complaints today. See Assessment / plan for status of chronic conditions. Since our last visit she has been following with sports medicine for osteoarthritis and sciatica.  Had a steroid injection a few months ago.  Did not seem to work well.  She will be seeing them again soon.   Lifestyle Diet: Balanced. Plenty of fruits and vegetables.  Exercise: Walking routinely.      06/20/2023    9:21 AM  Depression screen PHQ 2/9  Decreased Interest 0  Down, Depressed, Hopeless 0  PHQ - 2 Score 0    There are no preventive care reminders to display for this patient.   ROS: Per HPI, otherwise a complete review of systems was negative.   PMH:  The following were reviewed and entered/updated in epic: Past Medical History:  Diagnosis Date   Hyperlipidemia    Hypertension    Patient Active Problem List   Diagnosis Date Noted   Allergic rhinitis 11/15/2020   Sciatica, left side 12/15/2019   Hypertension 04/15/2017   Hyperlipidemia 04/15/2017   Osteoporosis 04/15/2017   Past Surgical History:  Procedure Laterality Date   WRIST FRACTURE SURGERY Bilateral     Family History  Problem Relation Age of Onset   Cancer Mother        Uterus   Hypertension Father     Medications- reviewed and updated Current Outpatient Medications  Medication Sig Dispense Refill   albuterol (VENTOLIN HFA) 108 (90 Base) MCG/ACT inhaler Inhale 2 puffs into the lungs every 4 (four) hours as needed for wheezing or shortness of breath. 1 each 2   alendronate (FOSAMAX) 70 MG tablet Take 1 tablet (70 mg total)  by mouth every 7 (seven) days. Take with a full glass of water on an empty stomach. 12 tablet 3   AMBULATORY NON FORMULARY MEDICATION Walker with seat.  Disp 1.  Try using aerocare Sciatica M54.32 1 each 0   Calcium Carbonate-Vitamin D (CALTRATE 600+D PO) Take by mouth.     Cholecalciferol (VITAMIN D3) 25 MCG (1000 UT) capsule Take 1 capsule (1,000  Units total) by mouth daily. 90 capsule 3   Multiple Vitamin (MULTIVITAMIN) tablet Take 1 tablet by mouth daily.     valsartan-hydrochlorothiazide (DIOVAN-HCT) 160-25 MG tablet Take 1 tablet by mouth daily. 90 tablet 3   No current facility-administered medications for this visit.    Allergies-reviewed and updated Allergies  Allergen Reactions   Penicillins Rash    Social History   Socioeconomic History   Marital status: Widowed    Spouse name: Not on file   Number of children: Not on file   Years of education: Not on file   Highest education level: Not on file  Occupational History   Occupation: retired  Tobacco Use   Smoking status: Never   Smokeless tobacco: Never  Vaping Use   Vaping status: Never Used  Substance and Sexual Activity   Alcohol use: No   Drug use: No   Sexual activity: Never  Other Topics Concern   Not on file  Social History Narrative   Moved to area from [name] in 2018   Social Drivers of Health   Financial Resource Strain: Low Risk  (10/03/2022)   Overall Financial Resource Strain (CARDIA)    Difficulty of Paying Living Expenses: Not hard at all  Food Insecurity: No Food Insecurity (10/03/2022)   Hunger Vital Sign    Worried About Running Out of Food in the Last Year: Never true    Ran Out of Food in the Last Year: Never true  Transportation Needs: No Transportation Needs (10/03/2022)   PRAPARE - Administrator, Civil Service (Medical): No    Lack of Transportation (Non-Medical): No  Physical Activity: Sufficiently Active (10/03/2022)   Exercise Vital Sign    Days of Exercise per Week: 5 days    Minutes of Exercise per Session: 30 min  Stress: No Stress Concern Present (10/03/2022)   [employer] of Occupational Health - Occupational Stress Questionnaire    Feeling of Stress : Not at all  Social Connections: Moderately Isolated (10/03/2022)   Social Connection and Isolation Panel [NHANES]    Frequency of Communication with Friends and  Family: More than three times a week    Frequency of Social Gatherings with Friends and Family: More than three times a week    Attends Religious Services: More than 4 times per year    Active Member of [employer] or Organizations: No    Attends Banker Meetings: Never    Marital Status: Widowed        Objective:  Physical Exam: BP 135/84   Pulse 64   Temp (!) 97.1 F (36.2 C) (Temporal)   Ht 5\' 2"  (1.575 m)   Wt 129 lb 9.6 oz (58.8 kg)   LMP  (LMP Unknown)   SpO2 98%   BMI 23.70 kg/m   Body mass index is 23.7 kg/m. Wt Readings from Last 3 Encounters:  06/20/23 129 lb 9.6 oz (58.8 kg)  02/04/23 132 lb (59.9 kg)  01/14/23 128 lb (58.1 kg)   Gen: NAD, resting comfortably HEENT: TMs normal bilaterally. OP clear. No thyromegaly noted.  CV: RRR with no murmurs appreciated Pulm: NWOB, CTAB with no crackles, wheezes, or rhonchi GI: Normal bowel sounds present. Soft, Nontender, Nondistended. MSK: no edema, cyanosis, or clubbing noted Skin: warm, dry Neuro: CN2-12 grossly intact. Strength 5/5 in upper and lower extremities. Reflexes symmetric and intact bilaterally.  Psych: Normal affect and thought content     Hayat Warbington M. Jimmey Ralph, MD 06/20/2023 10:08 AM

## 2023-06-20 NOTE — Assessment & Plan Note (Signed)
Blood pressure at goal on valsartan HCTZ 160-25 once daily.  Check labs.

## 2023-06-20 NOTE — Patient Instructions (Signed)
It was very nice to see you today!  We will check blood work today.  Please continue to work on diet and exercise.  You are due for baldness the skin later this year.  We will order this today.    We will see back in a year for your next physical.  Please come back sooner if needed.  Return in about 1 year (around 06/19/2024) for Annual Physical.   Take care, Dr Jimmey Ralph  PLEASE NOTE:  If you had any lab tests, please let us know if you have not heard back within a few days. You may see your results on mychart before we have a chance to review them but we will give you a call once they are reviewed by [place].   If we ordered any referrals today, please let us know if you have not heard from their office within the next week.   If you had any urgent prescriptions sent in today, please check with the pharmacy within an hour of our visit to make sure the prescription was transmitted appropriately.   Please try these tips to maintain a healthy lifestyle:  Eat at least 3 REAL meals and 1-2 snacks per day.  Aim for no more than 5 hours between eating.  If you eat breakfast, please do so within one hour of getting up.   Each meal should contain half fruits/vegetables, one quarter protein, and one quarter carbs (no bigger than a computer mouse)  Cut down on sweet beverages. This includes juice, soda, and sweet tea.   Drink at least 1 glass of water with each meal and aim for at least 8 glasses per day  Exercise at least 150 minutes every week.    Preventive Care 86 Years and Older, Female Preventive care refers to lifestyle choices and visits with your health care provider that can promote health and wellness. Preventive care visits are also called wellness exams. What can I expect for my preventive care visit? Counseling Your health care provider may ask you questions about your: Medical history, including: Past medical problems. Family medical history. Pregnancy and menstrual  history. History of falls. Current health, including: Memory and ability to understand (cognition). Emotional well-being. Home life and relationship well-being. Sexual activity and sexual health. Lifestyle, including: Alcohol, nicotine or tobacco, and drug use. Access to firearms. Diet, exercise, and sleep habits. Work and work Astronomer. Sunscreen use. Safety issues such as seatbelt and bike helmet use. Physical exam Your health care provider will check your: Height and weight. These may be used to calculate your BMI (body mass index). BMI is a measurement that tells if you are at a healthy weight. Waist circumference. This measures the distance around your waistline. This measurement also tells if you are at a healthy weight and may help predict your risk of certain diseases, such as type 2 diabetes and high blood pressure. Heart rate and blood pressure. Body temperature. Skin for abnormal spots. What immunizations do I need?  Vaccines are usually given at various ages, according to a schedule. Your health care provider will recommend vaccines for you based on your age, medical history, and lifestyle or other factors, such as travel or where you work. What tests do I need? Screening Your health care provider may recommend screening tests for certain conditions. This may include: Lipid and cholesterol levels. Hepatitis C test. Hepatitis B test. HIV (human immunodeficiency virus) test. STI (sexually transmitted infection) testing, if you are at risk. Lung cancer screening. Colorectal  cancer screening. Diabetes screening. This is done by checking your blood sugar (glucose) after you have not eaten for a while (fasting). Mammogram. Talk with your health care provider about how often you should have regular mammograms. BRCA-related cancer screening. This may be done if you have a family history of breast, ovarian, tubal, or peritoneal cancers. Bone density scan. This is done to  screen for osteoporosis. Talk with your health care provider about your test results, treatment options, and if necessary, the need for more tests. Follow these instructions at home: Eating and drinking  Eat a diet that includes fresh fruits and vegetables, whole grains, lean protein, and low-fat dairy products. Limit your intake of foods with high amounts of sugar, saturated fats, and salt. Take vitamin and mineral supplements as recommended by your health care provider. Do not drink alcohol if your health care provider tells you not to drink. If you drink alcohol: Limit how much you have to 0-1 drink a day. Know how much alcohol is in your drink. In the [place] one drink equals one 12 oz bottle of beer (355 mL), one 5 oz glass of wine (148 mL), or one 1 oz glass of hard liquor (44 mL). Lifestyle Brush your teeth every morning and night with fluoride toothpaste. Floss one time each day. Exercise for at least 30 minutes 5 or more days each week. Do not use any products that contain nicotine or tobacco. These products include cigarettes, chewing tobacco, and vaping devices, such as e-cigarettes. If you need help quitting, ask your health care provider. Do not use drugs. If you are sexually active, practice safe sex. Use a condom or other form of protection in order to prevent STIs. Take aspirin only as told by your health care provider. Make sure that you understand how much to take and what form to take. Work with your health care provider to find out whether it is safe and beneficial for you to take aspirin daily. Ask your health care provider if you need to take a cholesterol-lowering medicine (statin). Find healthy ways to manage stress, such as: Meditation, yoga, or listening to music. Journaling. Talking to a trusted person. Spending time with friends and family. Minimize exposure to UV radiation to reduce your risk of skin cancer. Safety Always wear your seat belt while driving or  riding in a vehicle. Do not drive: If you have been drinking alcohol. Do not ride with someone who has been drinking. When you are tired or distracted. While texting. If you have been using any mind-altering substances or drugs. Wear a helmet and other protective equipment during sports activities. If you have firearms in your house, make sure you follow all gun safety procedures. What's next? Visit your health care provider once a year for an annual wellness visit. Ask your health care provider how often you should have your eyes and teeth checked. Stay up to date on all vaccines. This information is not intended to replace advice given to you by your health care provider. Make sure you discuss any questions you have with your health care provider. Document Revised: 11/15/2020 Document Reviewed: 11/15/2020 Elsevier Patient Education  2024 [employer].

## 2023-06-20 NOTE — Assessment & Plan Note (Signed)
She is doing well with Fosamax 70 mg daily.  Due for repeat DEXA later this year.

## 2023-06-20 NOTE — Assessment & Plan Note (Signed)
Check lipids.  Not on any meds.

## 2023-06-24 ENCOUNTER — Encounter: Payer: Self-pay | Admitting: Family Medicine

## 2023-06-24 NOTE — Progress Notes (Signed)
Cholesterol is up quite a bit since last time.  She may benefit from starting a low-dose cholesterol medication to improve numbers and lower risk of heart attack and stroke.  Please send Lipitor 10 mg daily if she is agreeable to start.  Her sugar is borderline elevated.  Do not need to start meds for this but she should continue to work on diet and exercise and we can recheck in a year.  The rest of her labs are all at goal we can recheck everything else next year.

## 2023-06-30 ENCOUNTER — Other Ambulatory Visit: Payer: Self-pay

## 2023-06-30 ENCOUNTER — Other Ambulatory Visit: Payer: Self-pay | Admitting: *Deleted

## 2023-06-30 ENCOUNTER — Telehealth: Payer: Self-pay

## 2023-06-30 DIAGNOSIS — E785 Hyperlipidemia, unspecified: Secondary | ICD-10-CM

## 2023-06-30 MED ORDER — ATORVASTATIN CALCIUM 40 MG PO TABS: 40.0000 mg | ORAL_TABLET | Freq: Every day | ORAL | 3 refills | Status: DC

## 2023-06-30 MED ORDER — ATORVASTATIN CALCIUM 10 MG PO TABS: 10.0000 mg | ORAL_TABLET | Freq: Every day | ORAL | 1 refills | Status: DC

## 2023-06-30 NOTE — Telephone Encounter (Signed)
Spoke with pt, pt ok with starting Lipitor 40mg  - confirmed pt's pharmacy and escribed rx.

## 2023-06-30 NOTE — Telephone Encounter (Signed)
Copied from CRM [id number]. Topic: Clinical - Lab/Test Results >> Jun 30, 2023  9:18 AM Donita Brooks wrote: Reason for CRM: pt call in regarding test results. Provided test results to pt. pt did agree to the medication -  Lipitor 10 mg, pt would like for medication to be send to  [facility]-  [address], [place] [address] Phone number- [phone]

## 2023-07-04 ENCOUNTER — Other Ambulatory Visit: Payer: Self-pay | Admitting: Family Medicine

## 2023-07-04 MED ORDER — ALENDRONATE SODIUM 70 MG PO TABS: 70.0000 mg | ORAL_TABLET | ORAL | 3 refills | Status: DC

## 2023-07-04 NOTE — Telephone Encounter (Signed)
Copied from CRM [id number]. Topic: Clinical - Medication Refill >> Jul 04, 2023  9:16 AM Steele Sizer wrote: Most Recent Primary Care Visit:  Provider: Ardith Dark  Department: [facility]  Visit Type: PHYSICAL  Date: 06/20/2023  Medication: ***alendronate (FOSAMAX) 70 MG tablet   Has the patient contacted their pharmacy? No (Agent: If no, request that the patient contact the pharmacy for the refill. If patient does not wish to contact the pharmacy document the reason why and proceed with request.) (Agent: If yes, when and what did the pharmacy advise?) Pt said there are "no refills" on the package   Is this the correct pharmacy for this prescription? Yes If no, delete pharmacy and type the correct one.  This is the patient's preferred pharmacy:  [facility] [address], [place] - [address] [place] [address] Phone: [phone] Fax: [phone]  Has the prescription been filled recently? Yes  Is the patient out of the medication? No  Has the patient been seen for an appointment in the last year OR does the patient have an upcoming appointment?   Can we respond through MyChart?   Agent: Please be advised that Rx refills may take up to 3 business days. We ask that you follow-up with your pharmacy.

## 2023-07-04 NOTE — Telephone Encounter (Signed)
Copied from CRM [id number]. Topic: Clinical - Medication Refill >> Jul 04, 2023  9:16 AM Steele Sizer wrote: Most Recent Primary Care Visit:  Provider: Ardith Dark  Department: [facility]  Visit Type: PHYSICAL  Date: 06/20/2023  Medication: alendronate (FOSAMAX) 70 MG tablet   Has the patient contacted their pharmacy? No (Agent: If no, request that the patient contact the pharmacy for the refill. If patient does not wish to contact the pharmacy document the reason why and proceed with request.) (Agent: If yes, when and what did the pharmacy advise?) Pt said there are "no refills" on the package   Is this the correct pharmacy for this prescription? Yes If no, delete pharmacy and type the correct one.  This is the patient's preferred pharmacy:  [facility] [address] [place] - [address] [place] [address] Phone: [phone] Fax: [phone]  Has the prescription been filled recently? Yes  Is the patient out of the medication? No  Has the patient been seen for an appointment in the last year OR does the patient have an upcoming appointment? Yes  Can we respond through MyChart? No  Agent: Please be advised that Rx refills may take up to 3 business days. We ask that you follow-up with your pharmacy.

## 2023-08-07 ENCOUNTER — Telehealth: Payer: Self-pay

## 2023-08-07 ENCOUNTER — Ambulatory Visit (INDEPENDENT_AMBULATORY_CARE_PROVIDER_SITE_OTHER): Admitting: Primary Care

## 2023-08-07 ENCOUNTER — Ambulatory Visit: Payer: Self-pay | Admitting: Family Medicine

## 2023-08-07 NOTE — Telephone Encounter (Signed)
 Copied from CRM [id number]. Topic: Clinical - Medical Advice >> Aug 07, 2023 11:19 AM Deaijah H wrote: Reason for CRM: Lightheaded, slow walking, getting hot - [phone] daughter would like to be contacted if needed.     Forwarding this message to Dr. Jimmey Ralph. Donzetta Starch, CMA

## 2023-08-07 NOTE — Telephone Encounter (Signed)
 Please have them schedule a visit if needed.  Stephanie Hunt. Jimmey Ralph, MD 08/07/2023 12:34 PM

## 2023-08-07 NOTE — Telephone Encounter (Signed)
 Chief Complaint: dizzy Symptoms: lightheaded, nausea, headache Frequency: Intermittent since Monday 08/04/23  Pertinent Negatives: Patient denies chest pain, fever  Disposition: [ ] ED /[ ] Urgent Care (no appt availability in office) / [ X]Appointment(In office/virtual)/ [ ]  [facility]/ [ ] Home Care/ [ ] Refused Recommended Disposition /[ ] [facility]/ [ ]  Follow-up with PCP  Additional Notes:  Daughter Gavin Pound calling, they were out and about on Monday when Janina got hot feeling and lightheaded. This morning she experienced similar symptoms at home. This morning symptoms started after getting out of bed. She also develops headache after feeling lightheaded. Using Aleve with effect. States she is drinking well. She has not experienced lightheadness in the past. Office visit advised, PCP does not have availability today, offered and accepted office visit at another office. Educated daughter on reasons to call back or seek emergency evaluation. Verbalized understanding and agreeable to plan.   Copied from CRM [id number]. Topic: Clinical - Red Word Triage >> Aug 07, 2023 12:43 PM Gurney Maxin H wrote: [facility] that prompted transfer to Nurse Triage: Patient daughter states patient was walking and starting feeling lightheaded, getting hot and fatigued Reason for Disposition  [1] MODERATE dizziness (e.g., interferes with normal activities) AND [2] has NOT been evaluated by doctor (or NP/PA) for this  (Exception: Dizziness caused by heat exposure, sudden standing, or poor fluid intake.)  Answer Assessment - Initial Assessment Questions 1. DESCRIPTION: "Describe your dizziness."     Feels hot, 2. LIGHTHEADED: "Do you feel lightheaded?" (e.g., somewhat faint, woozy, weak upon standing)     Felt faint while at the store 3. VERTIGO: "Do you feel like either you or the room is spinning or tilting?" (i.e. vertigo)     No 4. SEVERITY: "How bad is it?"  "Do you feel like you are going to  faint?" "Can you stand and walk?"   - MILD: Feels slightly dizzy, but walking normally.   - MODERATE: Feels unsteady when walking, but not falling; interferes with normal activities (e.g., school, work).   - SEVERE: Unable to walk without falling, or requires assistance to walk without falling; feels like passing out now.      Moderate, unsteady when walking.  5. ONSET:  "When did the dizziness begin?"     Monday 6. AGGRAVATING FACTORS: "Does anything make it worse?" (e.g., standing, change in head position)     Standing 7. HEART RATE: "Can you tell me your heart rate?" "How many beats in 15 seconds?"  (Note: not all patients can do this)       Feels normal 8. CAUSE: "What do you think is causing the dizziness?"     Unknown 9. RECURRENT SYMPTOM: "Have you had dizziness before?" If Yes, ask: "When was the last time?" "What happened that time?"     No  10. OTHER SYMPTOMS: "Do you have any other symptoms?" (e.g., fever, chest pain, vomiting, diarrhea, bleeding)       None recent, one week ago diarrhea.  Protocols used: Dizziness - Lightheadedness-A-AH

## 2023-08-07 NOTE — Telephone Encounter (Signed)
 Noted.

## 2023-08-08 NOTE — Telephone Encounter (Signed)
 Appt scheduled

## 2023-08-11 ENCOUNTER — Ambulatory Visit (INDEPENDENT_AMBULATORY_CARE_PROVIDER_SITE_OTHER): Admitting: Family Medicine

## 2023-08-11 VITALS — BP 140/82 | HR 61 | Temp 97.3°F | Ht 62.0 in | Wt 132.0 lb

## 2023-08-11 DIAGNOSIS — I10 Essential (primary) hypertension: Secondary | ICD-10-CM | POA: Diagnosis not present

## 2023-08-11 DIAGNOSIS — R42 Dizziness and giddiness: Secondary | ICD-10-CM | POA: Diagnosis not present

## 2023-08-11 DIAGNOSIS — J309 Allergic rhinitis, unspecified: Secondary | ICD-10-CM | POA: Diagnosis not present

## 2023-08-11 DIAGNOSIS — N390 Urinary tract infection, site not specified: Secondary | ICD-10-CM | POA: Diagnosis not present

## 2023-08-11 LAB — URINALYSIS, ROUTINE W REFLEX MICROSCOPIC
Bilirubin Urine: NEGATIVE
Hgb urine dipstick: NEGATIVE
Ketones, ur: NEGATIVE
Leukocytes,Ua: NEGATIVE
Nitrite: NEGATIVE
RBC / HPF: NONE SEEN (ref 0–?)
Specific Gravity, Urine: 1.02 (ref 1.000–1.030)
Total Protein, Urine: NEGATIVE
Urine Glucose: NEGATIVE
Urobilinogen, UA: 0.2 (ref 0.0–1.0)
pH: 6.5 (ref 5.0–8.0)

## 2023-08-11 LAB — CBC
HCT: 41.6 % (ref 36.0–46.0)
Hemoglobin: 13.8 g/dL (ref 12.0–15.0)
MCHC: 33.2 g/dL (ref 30.0–36.0)
MCV: 90.2 fl (ref 78.0–100.0)
Platelets: 234 10*3/uL (ref 150.0–400.0)
RBC: 4.61 Mil/uL (ref 3.87–5.11)
RDW: 14.4 % (ref 11.5–15.5)
WBC: 5.6 10*3/uL (ref 4.0–10.5)

## 2023-08-11 LAB — COMPREHENSIVE METABOLIC PANEL
ALT: 18 U/L (ref 0–35)
AST: 20 U/L (ref 0–37)
Albumin: 4.1 g/dL (ref 3.5–5.2)
Alkaline Phosphatase: 91 U/L (ref 39–117)
BUN: 18 mg/dL (ref 6–23)
CO2: 28 meq/L (ref 19–32)
Calcium: 9.2 mg/dL (ref 8.4–10.5)
Chloride: 103 meq/L (ref 96–112)
Creatinine, Ser: 0.89 mg/dL (ref 0.40–1.20)
GFR: 58.65 mL/min — ABNORMAL LOW (ref >60.00)
Glucose, Bld: 88 mg/dL (ref 70–99)
Potassium: 3.5 meq/L (ref 3.5–5.1)
Sodium: 141 meq/L (ref 135–145)
Total Bilirubin: 0.4 mg/dL (ref 0.2–1.2)
Total Protein: 6.9 g/dL (ref 6.0–8.3)

## 2023-08-11 LAB — TSH: TSH: 1.77 u[IU]/mL (ref 0.35–5.50)

## 2023-08-11 MED ORDER — AZELASTINE HCL 0.1 % NA SOLN: 2.0000 | Freq: Two times a day (BID) | NASAL | 12 refills | Status: DC

## 2023-08-11 NOTE — Assessment & Plan Note (Signed)
 Blood pressure at goal today per JNC 8.  Orthostatic vital signs showed actual increase with standing.-Do not think that this is contributing to her above dizziness.  Will continue valsartan HCTZ 160-25 once daily.  Check labs.

## 2023-08-11 NOTE — Progress Notes (Signed)
 Stephanie Hunt is a 89 y.o. female who presents today for an office visit.  Assessment/Plan:  New/Acute Problems: Dizziness Overall reassuring exam today.  Normal neurologic exam.  She does have mild right ear effusion which may be contributing.  History sounds more like vasovagal episodes however she has not had any syncopal episodes or other red flag signs or symptoms.  We did discuss importance of good hydration.  Will check labs today.  Also discussed importance of compression stockings as well.  Given her normal neurologic exam do not think we need to check any imaging at this point.  Will check labs today including UA, CBC, c-Met, TSH.  If symptoms persist despite above would consider referral to cardiology.  We discussed reasons to return to care.  Right middle ear effusion No red flags.  No signs of otitis media.  May be contributing to her intermittent dizziness.  Will start Astelin nasal spray.  Chronic Problems Addressed Today: Allergic rhinitis Mild flare recently related to pollen outbreak.  She can continue over-the-counter allergy meds.  Will start Astelin as above.  Hypertension Blood pressure at goal today per JNC 8.  Orthostatic vital signs showed actual increase with standing.-Do not think that this is contributing to her above dizziness.  Will continue valsartan HCTZ 160-25 once daily.  Check labs.     Subjective:  HPI:  See A/P for status of chronic conditions.  Patient is here today with her daughter.  Primary concern today is intermittent dizziness for the last week or so.  This first happened a week ago.  She was shopping with her daughter when she suddenly felt hot and flushed.  This turned into feeling of unsteady with headache.  Her symptoms gradually resolved afterwards.  Did not lose consciousness.  Describes her dizziness as feeling like she is going to pass out.  She has not lost any consciousness over the last week or so.  Since her initial episode a week ago  she has had continued intermittent issues with similar episodes.  These usually occur randomly though typically can occur when she is walking around or on her feet.  Also they can be triggered when she is going from sitting to standing.  The sensation of feel like she is going to pass out is usually associated with a headache as well.  She denies any room spinning sensation.  No other obvious triggers.  No weakness or numbness.  No nausea or vomiting.  No abdominal pain.  No fevers or chills.  No constipation or diarrhea.  No vision changes.  She has had a little bit more nasal congestion the last week or so.        Objective:  Physical Exam: BP (!) 140/82   Pulse 61   Temp (!) 97.3 F (36.3 C)   Ht 5\' 2"  (1.575 m)   Wt 132 lb (59.9 kg)   LMP  (LMP Unknown)   SpO2 98%   BMI 24.14 kg/m    Orthostatic VS for the past 72 hrs (Last 3 readings):  Orthostatic BP Patient Position Orthostatic Pulse  08/11/23 0820 164/86 Standing 72  08/11/23 0738 (!) 157/98 Supine 63      Gen: No acute distress, resting comfortably HEENT: Left TM clear.  Right TM with clear effusion.  OP clear. CV: Regular rate and rhythm with no murmurs appreciated Pulm: Normal work of breathing, clear to auscultation bilaterally with no crackles, wheezes, or rhonchi Neuro: Cranial nerves II through XII intact.  Finger-nose -  finger testing intact Psych: Normal affect and thought content      Murice Barbar M. Jimmey Ralph, MD 08/11/2023 12:10 PM

## 2023-08-11 NOTE — Assessment & Plan Note (Signed)
 Mild flare recently related to pollen outbreak.  She can continue over-the-counter allergy meds.  Will start Astelin as above.

## 2023-08-11 NOTE — Patient Instructions (Signed)
 It was very nice to see you today!  Please start the nasal spray.  Check blood work and urine sample.  No follow-ups on file.   Take care, Dr Jimmey Ralph  PLEASE NOTE:  If you had any lab tests, please let us know if you have not heard back within a few days. You may see your results on mychart before we have a chance to review them but we will give you a call once they are reviewed by [place].   If we ordered any referrals today, please let us know if you have not heard from their office within the next week.   If you had any urgent prescriptions sent in today, please check with the pharmacy within an hour of our visit to make sure the prescription was transmitted appropriately.   Please try these tips to maintain a healthy lifestyle:  Eat at least 3 REAL meals and 1-2 snacks per day.  Aim for no more than 5 hours between eating.  If you eat breakfast, please do so within one hour of getting up.   Each meal should contain half fruits/vegetables, one quarter protein, and one quarter carbs (no bigger than a computer mouse)  Cut down on sweet beverages. This includes juice, soda, and sweet tea.   Drink at least 1 glass of water with each meal and aim for at least 8 glasses per day  Exercise at least 150 minutes every week.

## 2023-08-12 LAB — URINE CULTURE
MICRO NUMBER:: 16182104
SPECIMEN QUALITY:: ADEQUATE

## 2023-08-13 ENCOUNTER — Encounter: Payer: Self-pay | Admitting: Family Medicine

## 2023-08-13 NOTE — Progress Notes (Signed)
 Her blood work is all stable.  Urine sample is negative.  No signs of UTI.  She should let us know if her symptoms are not improving.

## 2023-10-02 NOTE — Progress Notes (Signed)
 I, Miquel Amen, CMA acting as a scribe for Garlan Juniper, MD.  Stephanie Hunt is a 89 y.o. female who presents to [facility] at [facility] today for bilat feet paraesthesia. Pt was previously seen by Dr. Cleora Daft on 02/04/23 for LBP.   Today, pt c/o bilat feet paraesthesia x several months. Pt locates paraesthesia to bilateral feet and left lower leg. C/O midline lower back pain. Denies sharp shooting pains. Pain in the feet at night. Tries to stay active by walking, using treadmill, or cycling. Back pain has gotten worse over the ast week. Denies fall.   Pertinent review of systems: No fevers or chills  Relevant historical information: Hypertension and osteoporosis and hyperlipidemia.  History of left-sided sciatica.   Exam:  BP 112/72   Pulse 66   Ht 5\' 2"  (1.575 m)   Wt 133 lb (60.3 kg)   LMP  (LMP Unknown)   SpO2 98%   BMI 24.33 kg/m  General: Well Developed, well nourished, and in no acute distress.   MSK: L-spine: Normal appearing Nontender palpation. Decreased lumbar motion. Lower extremity strength is intact.  Reflexes are intact. Decree sensation light touch left dorsal plantar foot and medial and lateral lower leg. Similar decreased sensation to light touch right dorsal and plantar foot.    Lab and Radiology Results  X-ray images lumbar spine obtained today personally and independently interpreted. Multilevel degeneration especially prominent at the L4-5 and L5-S1 levels.  No acute fractures are visible. Await formal radiology review  EXAM: MRI LUMBAR SPINE WITHOUT CONTRAST   TECHNIQUE: Multiplanar, multisequence MR imaging of the lumbar spine was performed. No intravenous contrast was administered.   COMPARISON:  Lumbar radiographs 12/01/2019   FINDINGS: Segmentation:  Normal.  Lowest disc space L5-S1   Alignment:  Normal sagittal alignment.   Vertebrae:  Normal bone marrow.  Negative for fracture or mass.   Conus medullaris and cauda  equina: Conus extends to the L1-2 level. Conus and cauda equina appear normal.   Paraspinal and other soft tissues: Large right renal cyst. No retroperitoneal mass or adenopathy.   Disc levels:   T12-L1: Mild degenerative change.  Negative for stenosis   L1-2: Mild disc and facet degeneration.  Negative for stenosis   L2-3: Moderate disc degeneration with disc space narrowing, Schmorl's nodes, and spurring asymmetric to the left. Bilateral facet hypertrophy. Mild spinal stenosis. Mild to moderate subarticular stenosis on the left.   L3-4: Disc degeneration with diffuse disc bulging, Schmorl's nodes, and endplate spurring. Bilateral facet hypertrophy. Moderate spinal stenosis with moderate subarticular stenosis bilaterally.   L4-5: Disc degeneration with disc bulging and endplate spurring. Moderate facet hypertrophy bilaterally. Moderate spinal stenosis and moderate subarticular and foraminal stenosis bilaterally.   L5-S1: Disc degeneration with Schmorl's node. Left-sided disc protrusion with severe subarticular stenosis on the left. Left S1 nerve root impingement due to disc protrusion, facet hypertrophy and possible small synovial cyst. Moderate spinal stenosis.   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 Multilevel spinal and subarticular stenosis as above   Severe subarticular stenosis on the left at L5-S1 with left S1 nerve root impingement due to disc protrusion, facet hypertrophy and possible small synovial cyst.     Electronically Signed   By: Anastasio Balsam M.D.   On: 01/31/2020 08:55   I, Garlan Juniper, personally (independently) visualized and performed the interpretation of the images attached in this note.   Assessment and Plan: 89 y.o. female with bilateral lower extremity paresthesias.  Most  likely this is peripheral neuropathy.  She could have some component of lumbosacral radiculopathy.  We can pin this down with a nerve conduction  study but she has had 1 of those and does not want to have another.  I can understand that completely.  Plan for low-dose gabapentin .  If not well-controlled enough consider nerve conduction study.  Consider possibly getting a new MRI or trying an epidural steroid injection.  Previous MRI is a bit old now from August 2021.   PDMP not reviewed this encounter. Orders Placed This Encounter  Procedures   DG Lumbar Spine 2-3 Views    Standing Status:   Future    Number of Occurrences:   1    Expiration Date:   11/03/2023    Reason for Exam (SYMPTOM  OR DIAGNOSIS REQUIRED):   low back pain    Preferred imaging location?:   [facility] ordered this encounter  Medications   gabapentin  (NEURONTIN ) 100 MG capsule    Sig: Take 1-3 capsules (100-300 mg total) by mouth at bedtime as needed.    Dispense:  90 capsule    Refill:  2     Discussed warning signs or symptoms. Please see discharge instructions. Patient expresses understanding.   The above documentation has been reviewed and is accurate and complete Garlan Juniper, M.D.

## 2023-10-03 ENCOUNTER — Ambulatory Visit (INDEPENDENT_AMBULATORY_CARE_PROVIDER_SITE_OTHER)

## 2023-10-03 ENCOUNTER — Ambulatory Visit (INDEPENDENT_AMBULATORY_CARE_PROVIDER_SITE_OTHER): Admitting: Family Medicine

## 2023-10-03 ENCOUNTER — Encounter: Payer: Self-pay | Admitting: Family Medicine

## 2023-10-03 ENCOUNTER — Other Ambulatory Visit: Payer: Self-pay | Admitting: Family Medicine

## 2023-10-03 VITALS — BP 112/72 | HR 66 | Ht 62.0 in | Wt 133.0 lb

## 2023-10-03 DIAGNOSIS — G8929 Other chronic pain: Secondary | ICD-10-CM | POA: Diagnosis not present

## 2023-10-03 DIAGNOSIS — M545 Low back pain, unspecified: Secondary | ICD-10-CM

## 2023-10-03 DIAGNOSIS — R202 Paresthesia of skin: Secondary | ICD-10-CM | POA: Insufficient documentation

## 2023-10-03 DIAGNOSIS — M47816 Spondylosis without myelopathy or radiculopathy, lumbar region: Secondary | ICD-10-CM | POA: Diagnosis not present

## 2023-10-03 MED ORDER — GABAPENTIN 100 MG PO CAPS: 100.0000 mg | ORAL_CAPSULE | Freq: Every evening | ORAL | 2 refills | Status: DC | PRN

## 2023-10-03 NOTE — Patient Instructions (Addendum)
 Thank you for coming in today.   Gabapentin  100 mg - 300 mg at bedtime as needed  Please get an Xray today before you leave   See you back in 6 weeks

## 2023-10-06 NOTE — Progress Notes (Signed)
 Lumbar spine x-ray shows some medium arthritis.

## 2023-10-07 ENCOUNTER — Telehealth: Payer: Self-pay | Admitting: Family Medicine

## 2023-10-07 ENCOUNTER — Ambulatory Visit: Payer: No Typology Code available for payment source

## 2023-10-07 NOTE — Telephone Encounter (Signed)
 Patient called and is giving a call back in regards to results. Please advise.

## 2023-10-07 NOTE — Telephone Encounter (Signed)
 See results note.

## 2023-11-28 ENCOUNTER — Observation Stay (HOSPITAL_COMMUNITY): Admission: EM | Admit: 2023-11-28 | Discharge: 2023-11-29 | Disposition: A | Discharge status: Home / Self Care

## 2023-11-28 ENCOUNTER — Encounter (HOSPITAL_COMMUNITY): Payer: Self-pay

## 2023-11-28 ENCOUNTER — Ambulatory Visit: Payer: Self-pay

## 2023-11-28 ENCOUNTER — Ambulatory Visit: Admitting: Family Medicine

## 2023-11-28 ENCOUNTER — Other Ambulatory Visit: Payer: Self-pay

## 2023-11-28 DIAGNOSIS — K921 Melena: Secondary | ICD-10-CM | POA: Diagnosis present

## 2023-11-28 DIAGNOSIS — K625 Hemorrhage of anus and rectum: Principal | ICD-10-CM

## 2023-11-28 DIAGNOSIS — I10 Essential (primary) hypertension: Secondary | ICD-10-CM | POA: Diagnosis not present

## 2023-11-28 DIAGNOSIS — E785 Hyperlipidemia, unspecified: Secondary | ICD-10-CM | POA: Diagnosis not present

## 2023-11-28 DIAGNOSIS — K922 Gastrointestinal hemorrhage, unspecified: Principal | ICD-10-CM | POA: Diagnosis present

## 2023-11-28 DIAGNOSIS — Z79899 Other long term (current) drug therapy: Secondary | ICD-10-CM | POA: Diagnosis not present

## 2023-11-28 DIAGNOSIS — E782 Mixed hyperlipidemia: Secondary | ICD-10-CM

## 2023-11-28 LAB — COMPREHENSIVE METABOLIC PANEL WITH GFR
ALT: 14 U/L (ref 0–44)
AST: 22 U/L (ref 15–41)
Albumin: 3.5 g/dL (ref 3.5–5.0)
Alkaline Phosphatase: 101 U/L (ref 38–126)
Anion gap: 11 (ref 5–15)
BUN: 13 mg/dL (ref 8–23)
CO2: 25 mmol/L (ref 22–32)
Calcium: 8.9 mg/dL (ref 8.9–10.3)
Chloride: 103 mmol/L (ref 98–111)
Creatinine, Ser: 0.91 mg/dL (ref 0.44–1.00)
GFR, Estimated: >60 mL/min (ref >60)
Glucose, Bld: 186 mg/dL — ABNORMAL HIGH (ref 70–99)
Potassium: 3.6 mmol/L (ref 3.5–5.1)
Sodium: 139 mmol/L (ref 135–145)
Total Bilirubin: 0.9 mg/dL (ref 0.0–1.2)
Total Protein: 6.9 g/dL (ref 6.5–8.1)

## 2023-11-28 LAB — CBC
HCT: 38.1 % (ref 36.0–46.0)
Hemoglobin: 12.6 g/dL (ref 12.0–15.0)
MCH: 29.9 pg (ref 26.0–34.0)
MCHC: 33.1 g/dL (ref 30.0–36.0)
MCV: 90.5 fL (ref 80.0–100.0)
Platelets: 285 10*3/uL (ref 150–400)
RBC: 4.21 MIL/uL (ref 3.87–5.11)
RDW: 14.7 % (ref 11.5–15.5)
WBC: 8.9 10*3/uL (ref 4.0–10.5)
nRBC: 0 % (ref 0.0–0.2)

## 2023-11-28 LAB — POC OCCULT BLOOD, ED: Fecal Occult Bld: POSITIVE — AB

## 2023-11-28 LAB — TYPE AND SCREEN
ABO/RH(D): O NEG
Antibody Screen: NEGATIVE

## 2023-11-28 MED ORDER — ONDANSETRON HCL 4 MG/2ML IJ SOLN: 4.0000 mg | Freq: Four times a day (QID) | INTRAMUSCULAR | Status: DC | PRN

## 2023-11-28 MED ORDER — ACETAMINOPHEN 325 MG PO TABS: 650.0000 mg | ORAL_TABLET | Freq: Four times a day (QID) | ORAL | Status: DC | PRN

## 2023-11-28 MED ORDER — HYDRALAZINE HCL 20 MG/ML IJ SOLN
10.0000 mg | INTRAMUSCULAR | Status: DC | PRN
  Administered 2023-11-28: 10 mg via INTRAVENOUS
  Filled 2023-11-28: qty 1

## 2023-11-28 MED ORDER — PANTOPRAZOLE SODIUM 40 MG IV SOLR
40.0000 mg | Freq: Once | INTRAVENOUS | Status: AC
  Administered 2023-11-28: 40 mg via INTRAVENOUS
  Filled 2023-11-28: qty 10

## 2023-11-28 MED ORDER — MELATONIN 3 MG PO TABS: 3.0000 mg | ORAL_TABLET | Freq: Every evening | ORAL | Status: DC | PRN

## 2023-11-28 MED ORDER — ACETAMINOPHEN 650 MG RE SUPP: 650.0000 mg | Freq: Four times a day (QID) | RECTAL | Status: DC | PRN

## 2023-11-28 NOTE — ED Triage Notes (Signed)
 Pt came in for bloody stools after taking her bone pill and blood pressure medication today. Pt had bloody stool x3 at home and once here in the lobby bathroom. Pt does not complain of any weakness or pain.

## 2023-11-28 NOTE — ED Provider Notes (Signed)
 [place] EMERGENCY DEPARTMENT AT [facility] Provider Note   CSN: [id number] Arrival date & time: 11/28/23  1721     Patient presents with: Rectal Bleeding   Stephanie Hunt is a 89 y.o. female.   Patient with history of hypertension, hyperlipidemia presents today with complaints of rectal bleeding. She states that same began this afternoon. States she has had 4 episodes of hematochezia. Is unsure if there were clots in the toilet as well or not. Denies any pain. No history of similar. No history of abdominal surgeries. She is not on blood thinners. She feels normal. Does note that she took her alendronate  a day later than she is supposed to and is worried this caused her symptoms. This is not a new medication for her. She denies abdominal pain, nausea or vomiting. She does note she has had a colonoscopy previously but is unsure what the results were as this was a long time ago.  The history is provided by the patient. No language interpreter was used.  Rectal Bleeding      Prior to Admission medications   Medication Sig Start Date End Date Taking? Authorizing Provider  albuterol  (VENTOLIN  HFA) 108 (90 Base) MCG/ACT inhaler Inhale 2 puffs into the lungs every 4 (four) hours as needed for wheezing or shortness of breath. 03/18/22   Jodie Lavern CROME, MD  alendronate  (FOSAMAX ) 70 MG tablet Take 1 tablet (70 mg total) by mouth every 7 (seven) days. Take with a full glass of water on an empty stomach. 07/04/23   Kennyth Worth HERO, MD  AMBULATORY NON FORMULARY MEDICATION Walker with seat.  Disp 1.  Try using aerocare Sciatica M54.32 01/19/20   Joane Artist RAMAN, MD  atorvastatin  (LIPITOR) 10 MG tablet Take 1 tablet (10 mg total) by mouth daily. 06/30/23   Kennyth Worth HERO, MD  azelastine  (ASTELIN ) 0.1 % nasal spray Place 2 sprays into both nostrils 2 (two) times daily. 08/11/23   Kennyth Worth HERO, MD  Calcium  Carbonate-Vitamin D  (CALTRATE 600+D PO) Take by mouth.    [provider]   Cholecalciferol (VITAMIN D3) 25 MCG (1000 UT) CAPS Take 1 capsule by mouth once daily 10/06/23   Kennyth Worth HERO, MD  gabapentin  (NEURONTIN ) 100 MG capsule Take 1-3 capsules (100-300 mg total) by mouth at bedtime as needed. 10/03/23   Corey, Evan S, MD  Multiple Vitamin (MULTIVITAMIN) tablet Take 1 tablet by mouth daily.    [provider]  valsartan -hydrochlorothiazide  (DIOVAN -HCT) 160-25 MG tablet Take 1 tablet by mouth daily. 06/20/23   Kennyth Worth HERO, MD    Allergies: Penicillins    Review of Systems  Gastrointestinal:  Positive for blood in stool and hematochezia.  All other systems reviewed and are negative.   Updated Vital Signs BP (!) 172/77 (BP Location: Right Arm)   Pulse 82   Temp 99 F (37.2 C) (Oral)   Resp 18   Ht 5' 2 (1.575 m)   Wt 61.2 kg   LMP  (LMP Unknown)   SpO2 99%   BMI 24.69 kg/m   Physical Exam Vitals and nursing note reviewed. Exam conducted with a chaperone present.  Constitutional:      General: She is not in acute distress.    Appearance: Normal appearance. She is normal weight. She is not ill-appearing, toxic-appearing or diaphoretic.  HENT:     Head: Normocephalic and atraumatic.   Cardiovascular:     Rate and Rhythm: Normal rate.  Pulmonary:     Effort: Pulmonary  effort is normal. No respiratory distress.  Abdominal:     General: Abdomen is flat.     Palpations: Abdomen is soft.     Tenderness: There is no abdominal tenderness.  Genitourinary:    Comments: Bright red blood present in the rectum. No visualized hemorrhoids, fissure, or other abnormalities  Musculoskeletal:        General: Normal range of motion.     Cervical back: Normal range of motion.   Skin:    General: Skin is warm and dry.   Neurological:     General: No focal deficit present.     Mental Status: She is alert.   Psychiatric:        Mood and Affect: Mood normal.        Behavior: Behavior normal.     (all labs ordered are listed, but only abnormal  results are displayed) Labs Reviewed  COMPREHENSIVE METABOLIC PANEL WITH GFR - Abnormal; Notable for the following components:      Result Value   Glucose, Bld 186 (*)    All other components within normal limits  POC OCCULT BLOOD, ED - Abnormal; Notable for the following components:   Fecal Occult Bld POSITIVE (*)    All other components within normal limits  CBC  TYPE AND SCREEN  ABO/RH    EKG: None  Radiology: No results found.   Procedures   Medications Ordered in the ED  pantoprazole (PROTONIX) injection 40 mg (has no administration in time range)                                    Medical Decision Making Amount and/or Complexity of Data Reviewed Labs: ordered.   This patient is a 89 y.o. female who presents to the ED for concern of rectal bleeding, this involves an extensive number of treatment options, and is a complaint that carries with it a high risk of complications and morbidity. The emergent differential diagnosis prior to evaluation includes, but is not limited to,  The differential diagnosis for lower GI bleed includes but is not limited to high flow upper GI bleed, diverticulosis, vascular ectasia/AVM, inflammatory bowel disease, infectious colitis, mesenteric ischemia or ischemic colitis,  colorectal cancer or polyps, internal hemorrhoids, aortoenteric fistula, rectal foreign body, rectal ulceration or anal fissure.  This is not an exhaustive differential.   Past Medical History / Co-morbidities / Social History:  has a past medical history of Hyperlipidemia and Hypertension.  Additional history: Chart reviewed.  Physical Exam: Physical exam performed. The pertinent findings include: Well appearing, abdomen soft and nontender. Rectal exam with bright red blood present.  Lab Tests: I ordered, and personally interpreted labs.  The pertinent results include:  hgb 12.6 (down from 13.8 3 months ago). Hemoccult positive   Medications: I ordered  medication including protonix  for GI bleed. Reevaluation of the patient after these medicines showed that the patient stayed the same. I have reviewed the patients home medicines and have made adjustments as needed.  Consultations Obtained: I requested consultation with the GI on call Dr. Kriss,  and discussed lab and imaging findings as well as pertinent plan - they recommend: left a message to add to their list for consultation tomorrow   Disposition: After consideration of the diagnostic results and the patients response to treatment, I feel that patient will require admission for GI bleed given age and persistent symptoms. Discussed plan with patient  who is understanding and agreement with this.  Discussed patient with hospitalist Dr. Marcene who accepts patient for admission  I discussed this case with my attending physician Dr. Gennaro who cosigned this note including patient's presenting symptoms, physical exam, and planned diagnostics and interventions. Attending physician stated agreement with plan or made changes to plan which were implemented.    Final diagnoses:  Rectal bleeding    ED Discharge Orders     None          Gladies Sofranko A, PA-C 11/28/23 2229    Kammerer, Megan L, DO 11/29/23 0003

## 2023-11-28 NOTE — Telephone Encounter (Signed)
 FYI Only or Action Required?: FYI only for provider.  Patient was last seen in primary care on 08/11/2023 by Kennyth Worth HERO, MD. Called Nurse Triage reporting Rectal Bleeding. Symptoms began today. Interventions attempted: Nothing. Symptoms are: No stool passing only about 1 cup of bright red blood with each BM. Feels fine other wise.rapidly worsening.  Triage Disposition: Go to ED Now (Notify PCP)  Patient/caregiver understands and will follow disposition?: Yes              Copied from CRM [id number]. Topic: Clinical - Red Word Triage >> Nov 28, 2023  4:24 PM [name] wrote: Red Word that prompted transfer to Nurse Triage: Patient having heavy dark blood coming out of anal area when moving bowels today Reason for Disposition  SEVERE rectal bleeding (large blood clots; constant or on and off bleeding)  Answer Assessment - Initial Assessment Questions 1. APPEARANCE of BLOOD: What color is it? Is it passed separately, on the surface of the stool, or mixed in with the stool?      Straight blood  - no stool 2. AMOUNT: How much blood was passed?      A cup each time 3times 3. FREQUENCY: How many times has blood been passed with the stools?      3 times 4. ONSET: When was the blood first seen in the stools? (Days or weeks)      today 5. DIARRHEA: Is there also some diarrhea? If Yes, ask: How many diarrhea stools in the past 24 hours?      no 6. CONSTIPATION: Do you have constipation? If Yes, ask: How bad is it?     no 7. RECURRENT SYMPTOMS: Have you had blood in your stools before? If Yes, ask: When was the last time? and What happened that time?      no 8. BLOOD THINNERS: Do you take any blood thinners? (e.g., Coumadin/warfarin, Pradaxa/dabigatran, aspirin)     no 9. OTHER SYMPTOMS: Do you have any other symptoms?  (e.g., abdomen pain, vomiting, dizziness, fever)     No - feels fine  Protocols used: Rectal Bleeding-A-AH

## 2023-11-28 NOTE — H&P (Signed)
 History and Physical      Stephanie Hunt FMW:[id number] DOB: 08-08-1934 DOA: 11/28/2023; DOS: 11/28/2023  PCP: Kennyth Worth HERO, MD *** Patient coming from: home ***  I have personally briefly reviewed patient's old medical records in [facility]  Chief Complaint: ***  HPI: Stephanie Hunt is a 89 y.o. female with medical history significant for *** who is admitted to [facility] on 11/28/2023 with *** after presenting from home*** to [facility] ED complaining of ***.   ***        ***  ED Course:  Vital signs in the ED were notable for the following: ***  Labs were notable for the following: ***  Per my interpretation, EKG in ED demonstrated the following:  ***  Imaging in the ED, per corresponding formal radiology read, was notable for the following: ***  EDP has contacted on-call [facility] gastroenterology, Dr. Kriss, requesting consult.  While in the ED, the following were administered: ***  Subsequently, the patient was admitted  ***  ***red   Review of Systems: As per HPI otherwise 10 point review of systems negative.   Past Medical History:  Diagnosis Date   Hyperlipidemia    Hypertension     Past Surgical History:  Procedure Laterality Date   WRIST FRACTURE SURGERY Bilateral     Social History:  reports that she has never smoked. She has never used smokeless tobacco. She reports that she does not drink alcohol and does not use drugs.   Allergies  Allergen Reactions   Penicillins Hives    Family History  Problem Relation Age of Onset   Cancer Mother        Uterus   Hypertension Father     Family history reviewed and not pertinent ***   Prior to Admission medications   Medication Sig Start Date End Date Taking? Authorizing Provider  albuterol  (VENTOLIN  HFA) 108 (90 Base) MCG/ACT inhaler Inhale 2 puffs into the lungs every 4 (four) hours as needed for wheezing or shortness of breath. 03/18/22   Jodie Lavern CROME, MD  alendronate  (FOSAMAX ) 70  MG tablet Take 1 tablet (70 mg total) by mouth every 7 (seven) days. Take with a full glass of water on an empty stomach. 07/04/23   Kennyth Worth HERO, MD  AMBULATORY NON FORMULARY MEDICATION Walker with seat.  Disp 1.  Try using aerocare Sciatica M54.32 01/19/20   Joane Artist RAMAN, MD  atorvastatin  (LIPITOR) 10 MG tablet Take 1 tablet (10 mg total) by mouth daily. 06/30/23   Kennyth Worth HERO, MD  azelastine  (ASTELIN ) 0.1 % nasal spray Place 2 sprays into both nostrils 2 (two) times daily. 08/11/23   Kennyth Worth HERO, MD  Calcium  Carbonate-Vitamin D  (CALTRATE 600+D PO) Take by mouth.    [provider]  Cholecalciferol (VITAMIN D3) 25 MCG (1000 UT) CAPS Take 1 capsule by mouth once daily 10/06/23   Kennyth Worth HERO, MD  gabapentin  (NEURONTIN ) 100 MG capsule Take 1-3 capsules (100-300 mg total) by mouth at bedtime as needed. 10/03/23   Corey, Evan S, MD  Multiple Vitamin (MULTIVITAMIN) tablet Take 1 tablet by mouth daily.    [provider]  valsartan -hydrochlorothiazide  (DIOVAN -HCT) 160-25 MG tablet Take 1 tablet by mouth daily. 06/20/23   Kennyth Worth HERO, MD     Objective    Physical Exam: Vitals:   11/28/23 2100 11/28/23 2123 11/28/23 2124 11/28/23 2200  BP:   (!) 170/88 (!) 150/72  Pulse:   71 81  Resp: 18  18   Temp:  98.2 F (36.8 C)    TempSrc:  Oral    SpO2:   100% 100%  Weight:      Height:        General: appears to be stated age; alert, oriented Skin: warm, dry, no rash Head:  AT/[place] Mouth:  Oral mucosa membranes appear moist, normal dentition Neck: supple; trachea midline Heart:  RRR; did not appreciate any M/R/G Lungs: CTAB, did not appreciate any wheezes, rales, or rhonchi Abdomen: + BS; soft, ND, NT Vascular: 2+ pedal pulses b/l; 2+ radial pulses b/l Extremities: no peripheral edema, no muscle wasting Neuro: strength and sensation intact in upper and lower extremities b/l    *** Neuro: 5/5 strength of the proximal and distal flexors and extensors of the  upper and lower extremities bilaterally; sensation intact in upper and lower extremities b/l; cranial nerves II through XII grossly intact; no pronator drift; no evidence suggestive of slurred speech, dysarthria, or facial droop; Normal muscle tone. No tremors. *** Neuro: In the setting of the patient's current mental status and associated inability to follow instructions, unable to perform full neurologic exam at this time.  As such, assessment of strength, sensation, and cranial nerves is limited at this time. Patient noted to spontaneously move all 4 extremities. No tremors.  ***    Labs on Admission: I have personally reviewed following labs and imaging studies  CBC: Recent Labs  Lab 11/28/23 1814  WBC 8.9  HGB 12.6  HCT 38.1  MCV 90.5  PLT 285   Basic Metabolic Panel: Recent Labs  Lab 11/28/23 1814  NA 139  K 3.6  CL 103  CO2 25  GLUCOSE 186*  BUN 13  CREATININE 0.91  CALCIUM  8.9   GFR: Estimated Creatinine Clearance: 38.2 mL/min (by C-G formula based on SCr of 0.91 mg/dL). Liver Function Tests: Recent Labs  Lab 11/28/23 1814  AST 22  ALT 14  ALKPHOS 101  BILITOT 0.9  PROT 6.9  ALBUMIN 3.5   No results for input(s): LIPASE, AMYLASE in the last 168 hours. No results for input(s): AMMONIA in the last 168 hours. Coagulation Profile: No results for input(s): INR, PROTIME in the last 168 hours. Cardiac Enzymes: No results for input(s): CKTOTAL, CKMB, CKMBINDEX, TROPONINI in the last 168 hours. BNP (last 3 results) No results for input(s): PROBNP in the last 8760 hours. HbA1C: No results for input(s): HGBA1C in the last 72 hours. CBG: No results for input(s): GLUCAP in the last 168 hours. Lipid Profile: No results for input(s): CHOL, HDL, LDLCALC, TRIG, CHOLHDL, LDLDIRECT in the last 72 hours. Thyroid  Function Tests: No results for input(s): TSH, T4TOTAL, FREET4, T3FREE, THYROIDAB in the last 72 hours. Anemia  Panel: No results for input(s): VITAMINB12, FOLATE, FERRITIN, TIBC, IRON, RETICCTPCT in the last 72 hours. Urine analysis:    Component Value Date/Time   COLORURINE YELLOW 08/11/2023 0824   APPEARANCEUR CLEAR 08/11/2023 0824   LABSPEC 1.020 08/11/2023 0824   PHURINE 6.5 08/11/2023 0824   GLUCOSEU NEGATIVE 08/11/2023 0824   HGBUR NEGATIVE 08/11/2023 0824   BILIRUBINUR NEGATIVE 08/11/2023 0824   KETONESUR NEGATIVE 08/11/2023 0824   UROBILINOGEN 0.2 08/11/2023 0824   NITRITE NEGATIVE 08/11/2023 0824   LEUKOCYTESUR NEGATIVE 08/11/2023 0824    Radiological Exams on Admission: No results found.    Assessment/Plan    Principal Problem:   Acute lower GI bleeding  ***        ***                  ***                  ***                  ***                  ***                 ***                  ***                  ***                  ***                  ***                  ***                  ***                 ***  DVT prophylaxis: SCD's ***  Code Status: Full code*** Family Communication: none*** Disposition Plan: Per Rounding Team Consults called: EDP has contacted on-call [facility] gastroenterology, Dr. Kriss, requesting consult.;  Admission status: ***    I SPENT GREATER THAN 75 *** MINUTES IN CLINICAL CARE TIME/MEDICAL DECISION-MAKING IN COMPLETING THIS ADMISSION.     Eva NOVAK Elizabella Nolet DO [facility] From 7PM - 7AM   11/28/2023, 10:26 PM   ***

## 2023-11-29 ENCOUNTER — Encounter (HOSPITAL_COMMUNITY): Payer: Self-pay | Admitting: Internal Medicine

## 2023-11-29 DIAGNOSIS — Z79899 Other long term (current) drug therapy: Secondary | ICD-10-CM | POA: Diagnosis not present

## 2023-11-29 DIAGNOSIS — K922 Gastrointestinal hemorrhage, unspecified: Secondary | ICD-10-CM | POA: Diagnosis not present

## 2023-11-29 DIAGNOSIS — E785 Hyperlipidemia, unspecified: Secondary | ICD-10-CM | POA: Diagnosis not present

## 2023-11-29 DIAGNOSIS — I10 Essential (primary) hypertension: Secondary | ICD-10-CM | POA: Diagnosis not present

## 2023-11-29 DIAGNOSIS — K921 Melena: Secondary | ICD-10-CM | POA: Diagnosis present

## 2023-11-29 LAB — COMPREHENSIVE METABOLIC PANEL WITH GFR
ALT: 14 U/L (ref 0–44)
AST: 16 U/L (ref 15–41)
Albumin: 3.2 g/dL — ABNORMAL LOW (ref 3.5–5.0)
Alkaline Phosphatase: 82 U/L (ref 38–126)
Anion gap: 7 (ref 5–15)
BUN: 13 mg/dL (ref 8–23)
CO2: 25 mmol/L (ref 22–32)
Calcium: 8.6 mg/dL — ABNORMAL LOW (ref 8.9–10.3)
Chloride: 108 mmol/L (ref 98–111)
Creatinine, Ser: 0.78 mg/dL (ref 0.44–1.00)
GFR, Estimated: >60 mL/min (ref >60)
Glucose, Bld: 110 mg/dL — ABNORMAL HIGH (ref 70–99)
Potassium: 3.5 mmol/L (ref 3.5–5.1)
Sodium: 140 mmol/L (ref 135–145)
Total Bilirubin: 0.6 mg/dL (ref 0.0–1.2)
Total Protein: 6.1 g/dL — ABNORMAL LOW (ref 6.5–8.1)

## 2023-11-29 LAB — CBC WITH DIFFERENTIAL/PLATELET
Abs Immature Granulocytes: 0.02 10*3/uL (ref 0.00–0.07)
Basophils Absolute: 0.1 10*3/uL (ref 0.0–0.1)
Basophils Relative: 1 %
Eosinophils Absolute: 0.2 10*3/uL (ref 0.0–0.5)
Eosinophils Relative: 3 %
HCT: 36.7 % (ref 36.0–46.0)
Hemoglobin: 11.9 g/dL — ABNORMAL LOW (ref 12.0–15.0)
Immature Granulocytes: 0 %
Lymphocytes Relative: 20 %
Lymphs Abs: 1.5 10*3/uL (ref 0.7–4.0)
MCH: 29.8 pg (ref 26.0–34.0)
MCHC: 32.4 g/dL (ref 30.0–36.0)
MCV: 91.8 fL (ref 80.0–100.0)
Monocytes Absolute: 0.8 10*3/uL (ref 0.1–1.0)
Monocytes Relative: 10 %
Neutro Abs: 5.2 10*3/uL (ref 1.7–7.7)
Neutrophils Relative %: 66 %
Platelets: 256 10*3/uL (ref 150–400)
RBC: 4 MIL/uL (ref 3.87–5.11)
RDW: 14.6 % (ref 11.5–15.5)
WBC: 7.8 10*3/uL (ref 4.0–10.5)
nRBC: 0 % (ref 0.0–0.2)

## 2023-11-29 LAB — HEMOGLOBIN AND HEMATOCRIT, BLOOD
HCT: 38.1 % (ref 36.0–46.0)
Hemoglobin: 12.1 g/dL (ref 12.0–15.0)

## 2023-11-29 LAB — MAGNESIUM: Magnesium: 2.1 mg/dL (ref 1.7–2.4)

## 2023-11-29 LAB — PROTIME-INR
INR: 1 (ref 0.8–1.2)
Prothrombin Time: 13.2 s (ref 11.4–15.2)

## 2023-11-29 LAB — APTT: aPTT: 31 s (ref 24–36)

## 2023-11-29 LAB — ABO/RH: ABO/RH(D): O NEG

## 2023-11-29 NOTE — Plan of Care (Signed)

## 2023-11-29 NOTE — Discharge Summary (Signed)
 Physician Discharge Summary  Tracia Lacomb FMW:[id number] DOB: 12/28/1934 DOA: 11/28/2023  PCP: Kennyth Worth HERO, MD  Admit date: 11/28/2023 Discharge date: 11/29/2023  Admitted From: Home Disposition:  Home  Recommendations for Outpatient Follow-up:  Follow up with PCP in 1-2 weeks Follow-up with GI as scheduled next 1 to 2 weeks Report back to the hospital if worsening symptoms, or notably large bleeding as discussed  Home Health: None Equipment/Devices: None  Discharge Condition: Stable CODE STATUS: Full Diet recommendation: Low-salt low-fat diet  Brief/Interim Summary: Stephanie Hunt is a 89 y.o. female with medical history significant for essential hypertension, hyperlipidemia, who is admitted to [facility] on 11/28/2023 complaints of bright red blood per rectum.  Patient admitted as above with concern over possible GI bleed given reports of bright red blood per rectum.  Patient was monitored overnight with essentially no change in hemoglobin (within lab margin of error) with no further reported episodes of bright red blood per rectum, dark stools or symptoms.  Discussed case with GI given patient's supplement history including bright red blood when she wipes history is more consistent with hemorrhoids.  Given age, stable hemoglobin, lack of symptoms and no high risk history, no current blood thinner use we discussed inpatient versus outpatient workup and family and patient decided that outpatient follow-up with GI was reasonable.  She is otherwise stable and agreeable for discharge with close outpatient follow-up as above.  Educated that if she were to have symptoms including but not limited to rectal pain, black stool, red stool nausea vomiting or abdominal distention she should report back to the hospital immediately.     Discharge Diagnoses:  Principal Problem:   Acute lower GI bleeding Active Problems:   Essential hypertension   HLD (hyperlipidemia)    Hematochezia    Discharge Instructions  Discharge Instructions     Call MD for:   Complete by: As directed    Signs of bleeding   Diet - low sodium heart healthy   Complete by: As directed    Increase activity slowly   Complete by: As directed       Allergies as of 11/29/2023       Reactions   Penicillins Hives        Medication List     STOP taking these medications    albuterol  108 (90 Base) MCG/ACT inhaler Commonly known as: VENTOLIN  HFA   AMBULATORY NON FORMULARY MEDICATION   azelastine  0.1 % nasal spray Commonly known as: ASTELIN        TAKE these medications    alendronate  70 MG tablet Commonly known as: FOSAMAX  Take 1 tablet (70 mg total) by mouth every 7 (seven) days. Take with a full glass of water on an empty stomach.   atorvastatin  10 MG tablet Commonly known as: LIPITOR Take 1 tablet (10 mg total) by mouth daily.   CALTRATE 600+D PO Take by mouth.   gabapentin  100 MG capsule Commonly known as: NEURONTIN  Take 1-3 capsules (100-300 mg total) by mouth at bedtime as needed.   multivitamin tablet Take 1 tablet by mouth daily.   valsartan -hydrochlorothiazide  160-25 MG tablet Commonly known as: DIOVAN -HCT Take 1 tablet by mouth daily.   Vitamin D3 25 MCG (1000 UT) Caps Take 1 capsule by mouth once daily        Allergies  Allergen Reactions   Penicillins Hives    Consultations: GI, [place]  Procedures/Studies: No results found.   Subjective: No acute issues or events overnight denies nausea vomiting diarrhea  constipation headache fevers or chest pain.  No further episodes of bright red blood per rectum since admission.   Discharge Exam: Vitals:   11/29/23 0703 11/29/23 1304  BP: 117/60 118/64  Pulse: 80 70  Resp: 16 18  Temp: 98 F (36.7 C) 98.7 F (37.1 C)  SpO2: 100% 99%   Vitals:   11/29/23 0334 11/29/23 0500 11/29/23 0703 11/29/23 1304  BP: 132/61  117/60 118/64  Pulse: 71  80 70  Resp:   16 18  Temp: 98.2  F (36.8 C)  98 F (36.7 C) 98.7 F (37.1 C)  TempSrc: Oral  Oral Oral  SpO2: 100%  100% 99%  Weight:  61 kg    Height:        General: Pt is alert, awake, not in acute distress Cardiovascular: RRR, S1/S2 +, no rubs, no gallops Respiratory: CTA bilaterally, no wheezing, no rhonchi Abdominal: Soft, NT, ND, bowel sounds + Extremities: no edema, no cyanosis    The results of significant diagnostics from this hospitalization (including imaging, microbiology, ancillary and laboratory) are listed below for reference.     Microbiology: No results found for this or any previous visit (from the past 240 hours).   Labs: BNP (last 3 results) No results for input(s): BNP in the last 8760 hours. Basic Metabolic Panel: Recent Labs  Lab 11/28/23 1814 11/29/23 0026 11/29/23 0027  NA 139  --  140  K 3.6  --  3.5  CL 103  --  108  CO2 25  --  25  GLUCOSE 186*  --  110*  BUN 13  --  13  CREATININE 0.91  --  0.78  CALCIUM  8.9  --  8.6*  MG  --  2.1  --    Liver Function Tests: Recent Labs  Lab 11/28/23 1814 11/29/23 0027  AST 22 16  ALT 14 14  ALKPHOS 101 82  BILITOT 0.9 0.6  PROT 6.9 6.1*  ALBUMIN 3.5 3.2*   No results for input(s): LIPASE, AMYLASE in the last 168 hours. No results for input(s): AMMONIA in the last 168 hours. CBC: Recent Labs  Lab 11/28/23 1814 11/29/23 0027 11/29/23 0904  WBC 8.9 7.8  --   NEUTROABS  --  5.2  --   HGB 12.6 11.9* 12.1  HCT 38.1 36.7 38.1  MCV 90.5 91.8  --   PLT 285 256  --    Cardiac Enzymes: No results for input(s): CKTOTAL, CKMB, CKMBINDEX, TROPONINI in the last 168 hours. BNP: Invalid input(s): POCBNP CBG: No results for input(s): GLUCAP in the last 168 hours. D-Dimer No results for input(s): DDIMER in the last 72 hours. Hgb A1c No results for input(s): HGBA1C in the last 72 hours. Lipid Profile No results for input(s): CHOL, HDL, LDLCALC, TRIG, CHOLHDL, LDLDIRECT in the last 72  hours. Thyroid  function studies No results for input(s): TSH, T4TOTAL, T3FREE, THYROIDAB in the last 72 hours.  Invalid input(s): FREET3 Anemia work up No results for input(s): VITAMINB12, FOLATE, FERRITIN, TIBC, IRON, RETICCTPCT in the last 72 hours. Urinalysis    Component Value Date/Time   COLORURINE YELLOW 08/11/2023 0824   APPEARANCEUR CLEAR 08/11/2023 0824   LABSPEC 1.020 08/11/2023 0824   PHURINE 6.5 08/11/2023 0824   GLUCOSEU NEGATIVE 08/11/2023 0824   HGBUR NEGATIVE 08/11/2023 0824   BILIRUBINUR NEGATIVE 08/11/2023 0824   KETONESUR NEGATIVE 08/11/2023 0824   UROBILINOGEN 0.2 08/11/2023 0824   NITRITE NEGATIVE 08/11/2023 0824   LEUKOCYTESUR NEGATIVE 08/11/2023 0824   Sepsis  Labs Recent Labs  Lab 11/28/23 1814 11/29/23 0027  WBC 8.9 7.8   Microbiology No results found for this or any previous visit (from the past 240 hours).   Time coordinating discharge: Over 30 minutes  SIGNED:   Elsie JAYSON Montclair, DO [facility] 11/29/2023, 4:15 PM Pager   If 7PM-7AM, please contact night-coverage [email or web address]

## 2023-11-29 NOTE — Progress Notes (Signed)
 Mobility Specialist - Progress Note   11/29/23 1114  Mobility  Activity Ambulated independently in hallway  Level of Assistance Independent  Assistive Device None  Distance Ambulated (ft) 460 ft  Activity Response Tolerated well  Mobility Referral Yes  Mobility visit 1 Mobility  Mobility Specialist Start Time (ACUTE ONLY) 1055  Mobility Specialist Stop Time (ACUTE ONLY) 1110  Mobility Specialist Time Calculation (min) (ACUTE ONLY) 15 min   Pt received in bed and agreeable to mobility. No complaints during session. Pt to bed after session with all needs met.     [facility]

## 2023-12-01 ENCOUNTER — Encounter: Payer: Self-pay | Admitting: Pediatrics

## 2023-12-01 ENCOUNTER — Telehealth: Payer: Self-pay

## 2023-12-01 NOTE — Telephone Encounter (Signed)
Pt went to ED and was admitted

## 2023-12-01 NOTE — Transitions of Care (Post Inpatient/ED Visit) (Signed)
   12/01/2023  Name: Stephanie Hunt MRN: [id number] DOB: Aug 29, 1934  Today's TOC FU Call Status: Today's TOC FU Call Status:: Successful TOC FU Call Completed TOC FU Call Complete Date: 12/01/23 Patient's Name and Date of Birth confirmed.  Transition Care Management Follow-up Telephone Call Date of Discharge: 11/29/23 Discharge Facility: [facility]) Type of Discharge: Inpatient Admission Primary Inpatient Discharge Diagnosis:: GI bleed How have you been since you were released from the hospital?: Better Any questions or concerns?: No  Items Reviewed: Did you receive and understand the discharge instructions provided?: Yes Medications obtained,verified, and reconciled?: Yes (Medications Reviewed) Any new allergies since your discharge?: No Dietary orders reviewed?: Yes Do you have support at home?: Yes People in Home [RPT]: child(ren), adult  Medications Reviewed Today: Medications Reviewed Today     Reviewed by Emmitt Pan, LPN (Licensed Practical Nurse) on 12/01/23 at 1600  Med List Status: <None>   Medication Order Taking? Sig Documenting Provider Last Dose Status Informant  alendronate  (FOSAMAX ) 70 MG tablet [id number] Yes Take 1 tablet (70 mg total) by mouth every 7 (seven) days. Take with a full glass of water on an empty stomach. Kennyth Worth HERO, MD  Active Self, Pharmacy Records  atorvastatin  (LIPITOR) 10 MG tablet [id number] Yes Take 1 tablet (10 mg total) by mouth daily. Kennyth Worth HERO, MD  Active Self, Pharmacy Records  Calcium  Carbonate-Vitamin D  (CALTRATE 600+D PO) [id number] Yes Take by mouth. [provider]  Active Self, Pharmacy Records  Cholecalciferol (VITAMIN D3) 25 MCG (1000 UT) CAPS [id number] Yes Take 1 capsule by mouth once daily Parker, Caleb M, MD  Active Self, Pharmacy Records  gabapentin  (NEURONTIN ) 100 MG capsule [id number] Yes Take 1-3 capsules (100-300 mg total) by mouth at bedtime as needed. Joane Artist RAMAN, MD  Active Self, Pharmacy Records   Multiple Vitamin (MULTIVITAMIN) tablet [id number] Yes Take 1 tablet by mouth daily. [provider]  Active Self, Pharmacy Records  valsartan -hydrochlorothiazide  (DIOVAN -HCT) 160-25 MG tablet [id number] Yes Take 1 tablet by mouth daily. Kennyth Worth HERO, MD  Active Self, Pharmacy Records            Home Care and Equipment/Supplies: Were Home Health Services Ordered?: NA Any new equipment or medical supplies ordered?: NA  Functional Questionnaire: Do you need assistance with bathing/showering or dressing?: No Do you need assistance with meal preparation?: No Do you need assistance with eating?: No Do you have difficulty maintaining continence: No Do you need assistance with getting out of bed/getting out of a chair/moving?: No Do you have difficulty managing or taking your medications?: No  Follow up appointments reviewed: PCP Follow-up appointment confirmed?: Yes Date of PCP follow-up appointment?: 12/02/23 Follow-up Provider: [facility] Follow-up appointment confirmed?: Yes Date of Specialist follow-up appointment?: 12/12/23 Follow-Up Specialty Provider:: GI Do you need transportation to your follow-up appointment?: No Do you understand care options if your condition(s) worsen?: Yes-patient verbalized understanding    SIGNATURE Pan Emmitt, LPN [facility] [place] Nurse Health Advisor Direct Dial [phone]

## 2023-12-02 ENCOUNTER — Other Ambulatory Visit (HOSPITAL_COMMUNITY): Payer: Self-pay

## 2023-12-02 ENCOUNTER — Telehealth: Payer: Self-pay

## 2023-12-02 ENCOUNTER — Encounter: Payer: Self-pay | Admitting: Family Medicine

## 2023-12-02 ENCOUNTER — Ambulatory Visit (INDEPENDENT_AMBULATORY_CARE_PROVIDER_SITE_OTHER): Admitting: Family Medicine

## 2023-12-02 VITALS — BP 112/80 | HR 78 | Temp 97.4°F | Ht 62.0 in | Wt 128.0 lb

## 2023-12-02 DIAGNOSIS — I10 Essential (primary) hypertension: Secondary | ICD-10-CM

## 2023-12-02 DIAGNOSIS — M5430 Sciatica, unspecified side: Secondary | ICD-10-CM | POA: Diagnosis not present

## 2023-12-02 DIAGNOSIS — K625 Hemorrhage of anus and rectum: Secondary | ICD-10-CM | POA: Diagnosis not present

## 2023-12-02 MED ORDER — LIDOCAINE 5 % EX PTCH: 1.0000 | MEDICATED_PATCH | CUTANEOUS | 0 refills | Status: AC

## 2023-12-02 NOTE — Patient Instructions (Signed)
 It was very nice to see you today!  Please keep your appointment with gastroenterology.  Let us  know if you have any large episodes of bleeding.  I believe your sciatica has flared up.  Please take the gabapentin .  You can use lidocaine patches as needed as well.  Please schedule an appoint with Dr. Margart if your symptoms are not improving.  Return if symptoms worsen or fail to improve.   Take care, Dr Kennyth  PLEASE NOTE:  If you had any lab tests, please let us  know if you have not heard back within a few days. You may see your results on mychart before we have a chance to review them but we will give you a call once they are reviewed by us .   If we ordered any referrals today, please let us  know if you have not heard from their office within the next week.   If you had any urgent prescriptions sent in today, please check with the pharmacy within an hour of our visit to make sure the prescription was transmitted appropriately.   Please try these tips to maintain a healthy lifestyle:  Eat at least 3 REAL meals and 1-2 snacks per day.  Aim for no more than 5 hours between eating.  If you eat breakfast, please do so within one hour of getting up.   Each meal should contain half fruits/vegetables, one quarter protein, and one quarter carbs (no bigger than a computer mouse)  Cut down on sweet beverages. This includes juice, soda, and sweet tea.   Drink at least 1 glass of water with each meal and aim for at least 8 glasses per day  Exercise at least 150 minutes every week.

## 2023-12-02 NOTE — Progress Notes (Signed)
 Chief Complaint:  Stephanie Hunt is a 89 y.o. female who presents today for a TCM visit.  Assessment/Plan:  New/Acute Problems: Rectal Bleeding  Symptoms have been improving since being discharged from the hospital last week.  Thought to be due to hemorrhoids.  She has increased her fiber supplementation and will follow-up with GI next week.  We discussed reasons to return to care sooner.  Right Leg Pain  Consistent with sciatica.  We did discuss prednisone  burst however she would like to hold off on this for now due to previous intolerance issues.  Recommended that she restart her gabapentin  as she has not been taking this for the last few days.  Also okay for her to use topical lidocaine patches.  If no improvement with this we will need to have her follow back up with sports medicine.  Chronic Problems Addressed Today: Sciatica Right leg pain consistent with sciatica.  Positive straight leg test on exam today which reproduces her pain.  She has been off of her gabapentin  for the last couple of weeks-she will restart this today which will help with her symptoms.  Also okay for her to use topical lidocaine patches as needed as well.  she will need to follow-up with sports medicine if not improving with these measures.  Essential hypertension At goal today on valsartan  HCTZ 160-25 once daily.  Tolerating well.    Subjective:  HPI:  Summary of Hospital admission: Reason for admission: GI bleed Date of admission: 11/28/2023 Date of discharge: 11/29/2023 Date of Interactive contact: 11/30/2024 Summary of Hospital course: Patient presented to the ED with bright red blood per rectum.  She was admitted and GI was consulted.  There was concern that this was due to hemorrhoids.  They elected on pursuing outpatient workup.  Her hemoglobin was stable during hospitalization and she was discharged home in stable condition.  Interim history:  She has done well since being home. She is still having  some bright red blood per rectum with bowel movements but this seems to be slowing down.  She has a normal GI next week.  She has been having some pain in her right calf. She was concerned about a blood clot and got up to move around and symptoms improved. This has been going on for a few days. She has noticed that her sciatica has been flared up for the last month.   ROS: Per HPI, otherwise a complete review of systems was negative.   PMH:  The following were reviewed and entered/updated in epic: Past Medical History:  Diagnosis Date   Hyperlipidemia    Hypertension    Patient Active Problem List   Diagnosis Date Noted   Paresthesia of lower extremity 10/03/2023   Allergic rhinitis 11/15/2020   Sciatica 12/15/2019   Essential hypertension 04/15/2017   HLD (hyperlipidemia) 04/15/2017   Osteoporosis 04/15/2017   Past Surgical History:  Procedure Laterality Date   WRIST FRACTURE SURGERY Bilateral     Family History  Problem Relation Age of Onset   Cancer Mother        Uterus   Hypertension Father     Medications- Reconciled discharge and current medications in Epic.  Current Outpatient Medications  Medication Sig Dispense Refill   alendronate  (FOSAMAX ) 70 MG tablet Take 1 tablet (70 mg total) by mouth every 7 (seven) days. Take with a full glass of water on an empty stomach. 12 tablet 3   atorvastatin  (LIPITOR) 10 MG tablet Take 1 tablet (10  mg total) by mouth daily. 90 tablet 1   gabapentin  (NEURONTIN ) 100 MG capsule Take 1-3 capsules (100-300 mg total) by mouth at bedtime as needed. 90 capsule 2   lidocaine (LIDODERM) 5 % Place 1 patch onto the skin daily. Remove & Discard patch within 12 hours or as directed by MD 30 patch 0   Multiple Vitamin (MULTIVITAMIN) tablet Take 1 tablet by mouth daily.     valsartan -hydrochlorothiazide  (DIOVAN -HCT) 160-25 MG tablet Take 1 tablet by mouth daily. 90 tablet 3   Calcium  Carbonate-Vitamin D  (CALTRATE 600+D PO) Take by mouth.      Cholecalciferol (VITAMIN D3) 25 MCG (1000 UT) CAPS Take 1 capsule by mouth once daily 90 capsule 0   No current facility-administered medications for this visit.    Allergies-reviewed and updated Allergies  Allergen Reactions   Penicillins Hives    Social History   Socioeconomic History   Marital status: Widowed    Spouse name: Not on file   Number of children: Not on file   Years of education: Not on file   Highest education level: Not on file  Occupational History   Occupation: retired  Tobacco Use   Smoking status: Never   Smokeless tobacco: Never  Vaping Use   Vaping status: Never Used  Substance and Sexual Activity   Alcohol use: No   Drug use: No   Sexual activity: Never  Other Topics Concern   Not on file  Social History Narrative   Moved to area from [name] in 2018   Social Drivers of Health   Financial Resource Strain: Low Risk  (10/03/2022)   Overall Financial Resource Strain (CARDIA)    Difficulty of Paying Living Expenses: Not hard at all  Food Insecurity: No Food Insecurity (11/28/2023)   Hunger Vital Sign    Worried About Running Out of Food in the Last Year: Never true    Ran Out of Food in the Last Year: Never true  Transportation Needs: No Transportation Needs (11/28/2023)   PRAPARE - Administrator, Civil Service (Medical): No    Lack of Transportation (Non-Medical): No  Physical Activity: Sufficiently Active (10/03/2022)   Exercise Vital Sign    Days of Exercise per Week: 5 days    Minutes of Exercise per Session: 30 min  Stress: No Stress Concern Present (10/03/2022)   [employer] of Occupational Health - Occupational Stress Questionnaire    Feeling of Stress : Not at all  Social Connections: Moderately Integrated (11/28/2023)   Social Connection and Isolation Panel    Frequency of Communication with Friends and Family: More than three times a week    Frequency of Social Gatherings with Friends and Family: Twice a week    Attends  Religious Services: More than 4 times per year    Active Member of [employer] or Organizations: Yes    Attends Banker Meetings: More than 4 times per year    Marital Status: Widowed        Objective:  Physical Exam: BP 112/80   Pulse 78   Temp (!) 97.4 F (36.3 C)   Ht 5' 2 (1.575 m)   Wt 128 lb (58.1 kg)   LMP  (LMP Unknown)   SpO2 97%   BMI 23.41 kg/m   Gen: NAD, resting comfortably CV: RRR with no murmurs appreciated Pulm: NWOB, CTAB with no crackles, wheezes, or rhonchi GI: Normal bowel sounds present. Soft, Nontender, Nondistended. MSK: No edema, cyanosis, or clubbing noted -  Right Leg: No deformities.  Distal pulses intact.  Straight leg raise positive.  Neurovascularly intact distally. Skin: Warm, dry Neuro: Grossly normal, moves all extremities Psych: Normal affect and thought content  Time Spent: 45 minutes of total time was spent on the date of the encounter performing the following actions: chart review prior to seeing the patient including recent hospitalization, obtaining history, performing a medically necessary exam, counseling on the treatment plan, placing orders, and documenting in our EHR.       Worth HERO. Kennyth, MD 12/02/2023 10:03 AM

## 2023-12-02 NOTE — Assessment & Plan Note (Signed)
 Right leg pain consistent with sciatica.  Positive straight leg test on exam today which reproduces her pain.  She has been off of her gabapentin  for the last couple of weeks-she will restart this today which will help with her symptoms.  Also okay for her to use topical lidocaine patches as needed as well.  she will need to follow-up with sports medicine if not improving with these measures.

## 2023-12-02 NOTE — Telephone Encounter (Signed)
 Pharmacy Patient Advocate Encounter  Received notification from [facility] that Prior Authorization for Lidocaine 5% patches has been DENIED.  Full denial letter will be uploaded to the media tab. See denial reason below.   PA #/Case ID/Reference #: [id number]  Insurance only covers Lidocaine patches for the following conditions.

## 2023-12-02 NOTE — Telephone Encounter (Signed)
 Pharmacy Patient Advocate Encounter   Received notification from Onbase that prior authorization for Lidocaine 5% patches is required/requested.   Insurance verification completed.   The patient is insured through [facility] .   Per test claim: PA required; PA submitted to above mentioned insurance via CoverMyMeds Key/confirmation #/EOC [id number] Status is pending

## 2023-12-02 NOTE — Assessment & Plan Note (Signed)
 At goal today on valsartan  HCTZ 160-25 once daily.  Tolerating well.

## 2023-12-03 ENCOUNTER — Telehealth: Payer: Self-pay

## 2023-12-03 NOTE — Telephone Encounter (Signed)
 I left pt a voicemail per her DPR with the information below.

## 2023-12-03 NOTE — Telephone Encounter (Signed)
 Yes she can try OTC lidocaine patches. The pharmacist should be able to help her find it in store.   Worth HERO. Kennyth, MD 12/03/2023 8:56 AM

## 2023-12-03 NOTE — Telephone Encounter (Signed)
 Copied from CRM [id number]. Topic: Clinical - Prescription Issue >> Dec 02, 2023 12:46 PM Stephanie Hunt wrote: Reason for CRM: Patient stated that her insurance company will not approve lidocaine (LIDODERM) 5 % [[id number]] and she wanted to know if there is an over the counter version she can get.

## 2023-12-10 ENCOUNTER — Ambulatory Visit

## 2023-12-10 VITALS — Ht 63.0 in | Wt 128.0 lb

## 2023-12-10 DIAGNOSIS — Z0000 Encounter for general adult medical examination without abnormal findings: Secondary | ICD-10-CM

## 2023-12-10 NOTE — Patient Instructions (Signed)
 Ms. Stephanie Hunt , Thank you for taking time out of your busy schedule to complete your Annual Wellness Visit with me. I enjoyed our conversation and look forward to speaking with you again next year. I, as well as your care team,  appreciate your ongoing commitment to your health goals. Please review the following plan we discussed and let me know if I can assist you in the future. Your Game plan/ To Do List    Referrals: If you haven't heard from the office you've been referred to, please reach out to them at the phone provided.   Follow up Visits: Next Medicare AWV with our clinical staff: 12/16/24   Have you seen your provider in the last 6 months (3 months if uncontrolled diabetes)? Yes Next Office Visit with your provider: will follow up for appt   Clinician Recommendations:  Aim for 30 minutes of exercise or brisk walking, 6-8 glasses of water, and 5 servings of fruits and vegetables each day.       This is a list of the screening recommended for you and due dates:  Health Maintenance  Topic Date Due   COVID-19 Vaccine (4 - 2024-25 season) 02/02/2023   DTaP/Tdap/Td vaccine (1 - Tdap) 06/19/2024*   Flu Shot  01/02/2024   DEXA scan (bone density measurement)  02/12/2024   Medicare Annual Wellness Visit  12/09/2024   Pneumococcal Vaccine for age over 61  Completed   Zoster (Shingles) Vaccine  Completed   Hepatitis B Vaccine  Aged Out   HPV Vaccine  Aged Out   Meningitis B Vaccine  Aged Out  *Topic was postponed. The date shown is not the original due date.    Advanced directives: (Copy Requested) Please bring a copy of your health care power of attorney and living will to the office to be added to your chart at your convenience. You can mail to [facility] [address] [place], [place] [address] or email to [email or web address] Advance Care Planning is important because it:  [x]  Makes sure you receive the medical care that is consistent with your values, goals,  and preferences  [x]  It provides guidance to your family and loved ones and reduces their decisional burden about whether or not they are making the right decisions based on your wishes.  Follow the link provided in your after visit summary or read over the paperwork we have mailed to you to help you started getting your Advance Directives in place. If you need assistance in completing these, please reach out to us  so that we can help you!  See attachments for Preventive Care and Fall Prevention Tips.

## 2023-12-10 NOTE — Progress Notes (Signed)
 Subjective:   Stephanie Hunt is a 89 y.o. who presents for a Medicare Wellness preventive visit.  As a reminder, Annual Wellness Visits don't include a physical exam, and some assessments may be limited, especially if this visit is performed virtually. We may recommend an in-person follow-up visit with your provider if needed.  Visit Complete: Virtual I connected with  Stephanie Hunt on 12/10/23 by a audio enabled telemedicine application and verified that I am speaking with the correct person using two identifiers.  Patient Location: Home  Provider Location: Home Office  I discussed the limitations of evaluation and management by telemedicine. The patient expressed understanding and agreed to proceed.  Vital Signs: Because this visit was a virtual/telehealth visit, some criteria may be missing or patient reported. Any vitals not documented were not able to be obtained and vitals that have been documented are patient reported.  VideoDeclined- This patient declined Librarian, academic. Therefore the visit was completed with audio only.  Persons Participating in Visit: Patient.  AWV Questionnaire: No: Patient Medicare AWV questionnaire was not completed prior to this visit.  Cardiac Risk Factors include: advanced age (>87men, >73 women);hypertension;dyslipidemia     Objective:    Today's Vitals   12/10/23 1422  Weight: 128 lb (58.1 kg)  Height: 5' 3 (1.6 m)   Body mass index is 22.67 kg/m.     12/10/2023    2:24 PM 11/28/2023   11:05 PM 11/28/2023    6:12 PM 10/03/2022    1:36 PM 09/20/2021    1:32 PM 04/20/2020    9:40 AM 03/17/2019   10:24 AM  Advanced Directives  Does Patient Have a Medical Advance Directive? Yes Yes Yes Yes Yes Yes Yes  Type of Estate agent of [place];Living will Healthcare Power of [place];Living will Healthcare Power of [place];Living will Healthcare Power of [place];Living will Living will Healthcare Power  of [place];Living will Healthcare Power of [place];Living will  Does patient want to make changes to medical advance directive?  No - Patient declined     No - Patient declined  Copy of Healthcare Power of Attorney in Chart? No - copy requested No - copy requested  No - copy requested  No - copy requested No - copy requested    Current Medications (verified) Outpatient Encounter Medications as of 12/10/2023  Medication Sig   alendronate  (FOSAMAX ) 70 MG tablet Take 1 tablet (70 mg total) by mouth every 7 (seven) days. Take with a full glass of water on an empty stomach.   atorvastatin  (LIPITOR) 10 MG tablet Take 1 tablet (10 mg total) by mouth daily.   Calcium  Carbonate-Vitamin D  (CALTRATE 600+D PO) Take by mouth.   Cholecalciferol (VITAMIN D3) 25 MCG (1000 UT) CAPS Take 1 capsule by mouth once daily   gabapentin  (NEURONTIN ) 100 MG capsule Take 1-3 capsules (100-300 mg total) by mouth at bedtime as needed.   lidocaine  (LIDODERM ) 5 % Place 1 patch onto the skin daily. Remove & Discard patch within 12 hours or as directed by MD   Multiple Vitamin (MULTIVITAMIN) tablet Take 1 tablet by mouth daily.   valsartan -hydrochlorothiazide  (DIOVAN -HCT) 160-25 MG tablet Take 1 tablet by mouth daily.   No facility-administered encounter medications on file as of 12/10/2023.    Allergies (verified) Penicillins   History: Past Medical History:  Diagnosis Date   Hyperlipidemia    Hypertension    Past Surgical History:  Procedure Laterality Date   WRIST FRACTURE SURGERY Bilateral    Family  History  Problem Relation Age of Onset   Cancer Mother        Uterus   Hypertension Father    Social History   Socioeconomic History   Marital status: Widowed    Spouse name: Not on file   Number of children: Not on file   Years of education: Not on file   Highest education level: Not on file  Occupational History   Occupation: retired  Tobacco Use   Smoking status: Never   Smokeless tobacco: Never   Vaping Use   Vaping status: Never Used  Substance and Sexual Activity   Alcohol use: No   Drug use: No   Sexual activity: Never  Other Topics Concern   Not on file  Social History Narrative   Moved to area from [name] in 2018   Social Drivers of Health   Financial Resource Strain: Low Risk  (12/10/2023)   Overall Financial Resource Strain (CARDIA)    Difficulty of Paying Living Expenses: Not hard at all  Food Insecurity: No Food Insecurity (12/10/2023)   Hunger Vital Sign    Worried About Running Out of Food in the Last Year: Never true    Ran Out of Food in the Last Year: Never true  Transportation Needs: No Transportation Needs (12/10/2023)   PRAPARE - Administrator, Civil Service (Medical): No    Lack of Transportation (Non-Medical): No  Physical Activity: Sufficiently Active (12/10/2023)   Exercise Vital Sign    Days of Exercise per Week: 5 days    Minutes of Exercise per Session: 30 min  Stress: No Stress Concern Present (12/10/2023)   [employer] of Occupational Health - Occupational Stress Questionnaire    Feeling of Stress: Not at all  Social Connections: Moderately Isolated (12/10/2023)   Social Connection and Isolation Panel    Frequency of Communication with Friends and Family: More than three times a week    Frequency of Social Gatherings with Friends and Family: More than three times a week    Attends Religious Services: More than 4 times per year    Active Member of [employer] or Organizations: No    Attends Banker Meetings: Not on file    Marital Status: Widowed    Tobacco Counseling Counseling given: Not Answered    Clinical Intake:  Pre-visit preparation completed: Yes  Pain : No/denies pain     BMI - recorded: 22.67 Nutritional Status: BMI of 19-24  Normal Diabetes: No  Lab Results  Component Value Date   HGBA1C 6.1 06/20/2023   HGBA1C 6.0 02/21/2022   HGBA1C 5.9 11/15/2020     How often do you need to have someone help  you when you read instructions, pamphlets, or other written materials from your doctor or pharmacy?: 1 - Never  Interpreter Needed?: No  Information entered by :: Ellouise Haws, LPN   Activities of Daily Living      12/10/2023    2:25 PM 11/28/2023   11:05 PM  In your present state of health, do you have any difficulty performing the following activities:  Hearing? 0 0  Vision? 0 0  Difficulty concentrating or making decisions? 0 0  Walking or climbing stairs? 0   Dressing or bathing? 0   Doing errands, shopping? 0 0  Preparing Food and eating ? N   Using the Toilet? N   In the past six months, have you accidently leaked urine? N   Do you have problems with loss  of bowel control? N   Managing your Medications? N   Managing your Finances? N   Housekeeping or managing your Housekeeping? N     Patient Care Team: Kennyth Worth HERO, MD as PCP - General (Family Medicine) Abigail Maude POUR as Consulting Physician (Optometry) Nicholaus, Sherlean CROME, [facility] (Inactive) (Pharmacist)  I have updated your Care Teams any recent Medical Services you may have received from other providers in the past year.     Assessment:   This is a routine wellness examination for [place].  Hearing/Vision screen Hearing Screening - Comments:: Pt denies any hearing issues  Vision Screening - Comments:: Wears rx glasses - up to date with routine eye exams with was with Dr Abigail with follow up with new provider    Goals Addressed             This Visit's Progress    Patient Stated       Maintain health and activity        Depression Screen     12/10/2023    2:26 PM 06/20/2023    9:21 AM 10/03/2022    1:34 PM 08/06/2022   10:51 AM 03/18/2022   11:07 AM 02/21/2022    8:20 AM 09/20/2021    1:32 PM  PHQ 2/9 Scores  PHQ - 2 Score 0 0 0 0 0 0 0    Fall Risk     12/10/2023    2:27 PM 06/20/2023    9:21 AM 10/03/2022    1:36 PM 08/06/2022   10:51 AM 03/18/2022   11:07 AM  Fall Risk   Falls in the past year? 0 0 0  0 0  Number falls in past yr: 0 0 0 0 0  Injury with Fall? 0 0 0 0 0  Risk for fall due to : No Fall Risks No Fall Risks Impaired vision No Fall Risks No Fall Risks  Follow up Falls prevention discussed  Falls prevention discussed  Falls evaluation completed      Data saved with a previous flowsheet row definition    MEDICARE RISK AT HOME:  Medicare Risk at Home Any stairs in or around the home?: Yes If so, are there any without handrails?: No Home free of loose throw rugs in walkways, pet beds, electrical cords, etc?: Yes Adequate lighting in your home to reduce risk of falls?: Yes Life alert?: No Use of a cane, walker or w/c?: No Grab bars in the bathroom?: No Shower chair or bench in shower?: No Elevated toilet seat or a handicapped toilet?: No  TIMED UP AND GO:  Was the test performed?  No  Cognitive Function: 6CIT completed    08/21/2017   11:03 AM  MMSE - Mini Mental State Exam  Not completed: --      Significant value        12/10/2023    2:28 PM 10/03/2022    1:37 PM 09/20/2021    1:36 PM 04/20/2020    9:43 AM 03/17/2019   10:25 AM  6CIT Screen  What Year? 0 points 0 points 0 points 0 points 0 points  What month? 0 points 0 points 0 points 0 points 0 points  What time? 0 points 0 points 0 points  0 points  Count back from 20 0 points 0 points 0 points 0 points 0 points  Months in reverse 0 points 0 points 0 points 0 points 0 points  Repeat phrase 0 points 0 points 0 points 0 points  0 points  Total Score 0 points 0 points 0 points  0 points    Immunizations Immunization History  Administered Date(s) Administered   Fluad Quad(high Dose 65+) 03/17/2019, 03/22/2020, 02/21/2022   Fluad Trivalent(High Dose 65+) 04/01/2023   Influenza, High Dose Seasonal PF 03/24/2018   Influenza-Unspecified 02/21/2021   PFIZER(Purple Top)SARS-COV-2 Vaccination 07/10/2019, 07/31/2019, 04/12/2020   PNEUMOCOCCAL CONJUGATE-20 11/15/2020   Zoster Recombinant(Shingrix) 03/17/2019,  07/02/2019    Screening Tests Health Maintenance  Topic Date Due   COVID-19 Vaccine (4 - 2024-25 season) 02/02/2023   DTaP/Tdap/Td (1 - Tdap) 06/19/2024 (Originally 01/20/1954)   INFLUENZA VACCINE  01/02/2024   DEXA SCAN  02/12/2024   Medicare Annual Wellness (AWV)  12/09/2024   Pneumococcal Vaccine: 50+ Years  Completed   Zoster Vaccines- Shingrix  Completed   Hepatitis B Vaccines  Aged Out   HPV VACCINES  Aged Out   Meningococcal B Vaccine  Aged Out    Health Maintenance  Health Maintenance Due  Topic Date Due   COVID-19 Vaccine (4 - 2024-25 season) 02/02/2023   Health Maintenance Items Addressed: See Nurse Notes at the end of this note  Additional Screening:  Vision Screening: Recommended annual ophthalmology exams for early detection of glaucoma and other disorders of the eye. Would you like a referral to an eye doctor? No    Dental Screening: Recommended annual dental exams for proper oral hygiene  Community Resource Referral / Chronic Care Management: CRR required this visit?  No   CCM required this visit?  No   Plan:    I have personally reviewed and noted the following in the patient's chart:   Medical and social history Use of alcohol, tobacco or illicit drugs  Current medications and supplements including opioid prescriptions. Patient is not currently taking opioid prescriptions. Functional ability and status Nutritional status Physical activity Advanced directives List of other physicians Hospitalizations, surgeries, and ER visits in previous 12 months Vitals Screenings to include cognitive, depression, and falls Referrals and appointments  In addition, I have reviewed and discussed with patient certain preventive protocols, quality metrics, and best practice recommendations. A written personalized care plan for preventive services as well as general preventive health recommendations were provided to patient.   Ellouise VEAR Haws, LPN   2/0/7974    After Visit Summary: (MyChart) Due to this being a telephonic visit, the after visit summary with patients personalized plan was offered to patient via MyChart   Notes: Nothing significant to report at this time.

## 2023-12-12 ENCOUNTER — Ambulatory Visit: Admitting: Pediatrics

## 2023-12-18 ENCOUNTER — Other Ambulatory Visit: Payer: Self-pay | Admitting: Family Medicine

## 2023-12-31 DIAGNOSIS — M810 Age-related osteoporosis without current pathological fracture: Secondary | ICD-10-CM | POA: Diagnosis not present

## 2023-12-31 DIAGNOSIS — Z008 Encounter for other general examination: Secondary | ICD-10-CM | POA: Diagnosis not present

## 2023-12-31 DIAGNOSIS — R7303 Prediabetes: Secondary | ICD-10-CM | POA: Diagnosis not present

## 2024-01-06 ENCOUNTER — Other Ambulatory Visit: Payer: Self-pay | Admitting: *Deleted

## 2024-01-06 MED ORDER — ATORVASTATIN CALCIUM 10 MG PO TABS: 10.0000 mg | ORAL_TABLET | Freq: Every day | ORAL | 1 refills | Status: DC

## 2024-02-11 ENCOUNTER — Ambulatory Visit: Admitting: Pediatrics

## 2024-03-25 ENCOUNTER — Encounter: Payer: Self-pay | Admitting: Family Medicine

## 2024-03-25 ENCOUNTER — Ambulatory Visit (INDEPENDENT_AMBULATORY_CARE_PROVIDER_SITE_OTHER): Admitting: Family Medicine

## 2024-03-25 VITALS — BP 120/80 | HR 63 | Temp 97.4°F | Ht 63.0 in | Wt 130.8 lb

## 2024-03-25 DIAGNOSIS — I10 Essential (primary) hypertension: Secondary | ICD-10-CM | POA: Diagnosis not present

## 2024-03-25 DIAGNOSIS — M79602 Pain in left arm: Secondary | ICD-10-CM

## 2024-03-25 DIAGNOSIS — E782 Mixed hyperlipidemia: Secondary | ICD-10-CM

## 2024-03-25 NOTE — Patient Instructions (Signed)
 It was very nice to see you today!  VISIT SUMMARY: During your visit, we discussed your arm pain, blood pressure, cholesterol medication, and flu vaccination. We provided recommendations for managing your symptoms and maintaining your overall health.  YOUR PLAN: CERVICAL RADICULOPATHY: You have pain and tingling from your lower neck radiating down your arm, likely due to a pinched nerve. -Continue using a heating pad and topical analgesic as needed. -Use ibuprofen or acetaminophen  for pain relief. -Perform the stretches and exercises provided. -Reassess if there is no improvement in a couple of weeks.  ESSENTIAL HYPERTENSION: Your blood pressure is well-controlled with your current medication. -Continue taking your blood pressure medication as prescribed.  HYPERLIPIDEMIA: You are on atorvastatin  10 mg daily but have been using it inconsistently. There is limited benefit from statins at your age. Jacqualine your current supply of atorvastatin . -We will reassess your lipid levels during your January physical examination. -We will discuss the potential discontinuation of atorvastatin  based on your lipid levels and preference.  GENERAL HEALTH MAINTENANCE: We discussed your concern about the flu shot and you agreed to receive it. -You received the flu vaccine during this visit.  Return if symptoms worsen or fail to improve.   Take care, Dr Kennyth  PLEASE NOTE:  If you had any lab tests, please let us  know if you have not heard back within a few days. You may see your results on mychart before we have a chance to review them but we will give you a call once they are reviewed by us .   If we ordered any referrals today, please let us  know if you have not heard from their office within the next week.   If you had any urgent prescriptions sent in today, please check with the pharmacy within an hour of our visit to make sure the prescription was transmitted appropriately.   Please try these  tips to maintain a healthy lifestyle:  Eat at least 3 REAL meals and 1-2 snacks per day.  Aim for no more than 5 hours between eating.  If you eat breakfast, please do so within one hour of getting up.   Each meal should contain half fruits/vegetables, one quarter protein, and one quarter carbs (no bigger than a computer mouse)  Cut down on sweet beverages. This includes juice, soda, and sweet tea.   Drink at least 1 glass of water with each meal and aim for at least 8 glasses per day  Exercise at least 150 minutes every week.

## 2024-03-25 NOTE — Assessment & Plan Note (Signed)
 At goal today on valsartan  HCTZ 160-25 once daily.  Tolerating well.

## 2024-03-25 NOTE — Assessment & Plan Note (Signed)
 She is on Lipitor but would like to discontinue.  She will finish up the current prescription she has and we can recheck lipids again when she comes back for CPE early next year.  We did discuss continuing however she would like avoid any unnecessary medications.

## 2024-03-25 NOTE — Progress Notes (Signed)
   Stephanie Hunt is a 89 y.o. female who presents today for an office visit.  Assessment/Plan:  New/Acute Problems: Left Arm Pain  Consistent with mild cervical radiculopathy.  Her symptoms are reproducible on exam.  Overall reassuring exam and it is reassuring that symptoms have improved over the last several days.  We did discuss trial of anti-inflammatories or systemic steroids however she declined.  She can use over-the-counter ibuprofen and Tylenol  as needed for pain relief as she has been doing.  We discussed home exercise program and handout was given.  She will let us  know if not proving in the next 1 to 2 weeks.  Chronic Problems Addressed Today: Essential hypertension At goal today on valsartan  HCTZ 160-25 once daily. Tolerating well.   HLD (hyperlipidemia) She is on Lipitor but would like to discontinue.  She will finish up the current prescription she has and we can recheck lipids again when she comes back for CPE early next year.  We did discuss continuing however she would like avoid any unnecessary medications.  Preventative health care Flu vaccine given today.    Subjective:  HPI:  See assessment / plan for status of chronic conditions.    Discussed the use of AI scribe software for clinical note transcription with the patient, who gave verbal consent to proceed.  History of Present Illness Stephanie Hunt is an 89 year old female who presents with arm pain radiating from the back to the arm.  The pain originates from her back, moves up, and then radiates down her arm. It began last week and has improved over the past few days. She uses a heating pad, a topical product called 'stop pain', and Tylenol  for relief. No specific injury is reported. She experiences a tingling sensation in the arm, particularly in the area where the pain was initially felt. No neck pain is present unless she sleeps in an awkward position, in which case she uses a heating pad for relief.  She  discusses her cholesterol medication, noting that she does not take it daily as it was prescribed at a low dose. She has some medication left. She has made dietary changes, reducing cheese and fries while increasing fruits and vegetables.  She expresses concern about receiving the flu shot due to a previous experience of getting sick after vaccination.         Objective:  Physical Exam: BP 120/80   Pulse 63   Temp (!) 97.4 F (36.3 C) (Temporal)   Ht 5' 3 (1.6 m)   Wt 130 lb 12.8 oz (59.3 kg)   LMP  (LMP Unknown)   SpO2 99%   BMI 23.17 kg/m   Gen: No acute distress, resting comfortably CV: Regular rate and rhythm with no murmurs appreciated Pulm: Normal work of breathing, clear to auscultation bilaterally with no crackles, wheezes, or rhonchi MUSCULOSKELETAL - Left Arm: No deformities.  Tender to palpation along lower left cervical paraspinal muscles.  Normal supraspinatus testing.  Normal testing with internal and external rotation.  Neurovascular intact distally.  Neer and Hawking test negative.  Tinel sign negative at wrist and medial epicondyle.  Spurling positive. Neuro: Grossly normal, moves all extremities Psych: Normal affect and thought content      Rande Dario M. Kennyth, MD 03/25/2024 10:07 AM

## 2024-04-01 ENCOUNTER — Ambulatory Visit: Admitting: Pediatrics

## 2024-05-25 ENCOUNTER — Other Ambulatory Visit: Payer: Self-pay | Admitting: *Deleted

## 2024-05-25 ENCOUNTER — Telehealth: Payer: Self-pay | Admitting: Family Medicine

## 2024-05-25 ENCOUNTER — Other Ambulatory Visit: Payer: Self-pay | Admitting: Family Medicine

## 2024-05-25 MED ORDER — ATORVASTATIN CALCIUM 10 MG PO TABS: 10.0000 mg | ORAL_TABLET | Freq: Every day | ORAL | 1 refills | Status: AC

## 2024-05-25 MED ORDER — GABAPENTIN 100 MG PO CAPS: 100.0000 mg | ORAL_CAPSULE | Freq: Every evening | ORAL | 2 refills | Status: AC | PRN

## 2024-05-25 NOTE — Telephone Encounter (Signed)
 Last OV 10/03/23 Next OV not scheduled  Last refill 10/03/23 Qty #90/2

## 2024-05-25 NOTE — Telephone Encounter (Signed)
 Patient called requesting a refill on: gabapentin  (NEURONTIN ) 100 MG capsule   Pharmacy: [facility].

## 2024-05-25 NOTE — Telephone Encounter (Signed)
 RX refilled

## 2024-05-25 NOTE — Telephone Encounter (Signed)
 Rx send to pharmacy Patient notified Rx Gabapentin  need to call Dr Darleene for refills  Verbalized understanding

## 2024-05-25 NOTE — Telephone Encounter (Unsigned)
 Copied from CRM #[id number]. Topic: Clinical - Medication Refill >> May 25, 2024  8:44 AM Harlene ORN wrote: Medication: alendronate  (FOSAMAX ) 70 MG tablet, valsartan -hydrochlorothiazide  (DIOVAN -HCT) 160-25 MG tablet, gabapentin  (NEURONTIN ) 100 MG capsule, atorvastatin  (LIPITOR) 10 MG tablet  Has the patient contacted their pharmacy? No (Agent: If no, request that the patient contact the pharmacy for the refill. If patient does not wish to contact the pharmacy document the reason why and proceed with request.) (Agent: If yes, when and what did the pharmacy advise?)  This is the patient's preferred pharmacy:  [facility] [address], [place] - [address] [place] [address] Phone: [phone] Fax: [phone]  Is this the correct pharmacy for this prescription? Yes If no, delete pharmacy and type the correct one.   Has the prescription been filled recently? No  Is the patient out of the medication? No  Has the patient been seen for an appointment in the last year OR does the patient have an upcoming appointment? Yes  Can we respond through MyChart? No  Agent: Please be advised that Rx refills may take up to 3 business days. We ask that you follow-up with your pharmacy.

## 2024-07-02 ENCOUNTER — Encounter: Admitting: Family Medicine

## 2024-12-10 ENCOUNTER — Encounter: Admitting: Family Medicine

## 2024-12-16 ENCOUNTER — Ambulatory Visit
